# Patient Record
Sex: Female | Born: 1935
Health system: Southern US, Community
[De-identification: ages and names within clinical notes are randomized; demographics above are authoritative.]

## PROBLEM LIST (undated history)

## (undated) DIAGNOSIS — I10 Essential (primary) hypertension: Secondary | ICD-10-CM

## (undated) DIAGNOSIS — F419 Anxiety disorder, unspecified: Secondary | ICD-10-CM

## (undated) DIAGNOSIS — K759 Inflammatory liver disease, unspecified: Secondary | ICD-10-CM

## (undated) DIAGNOSIS — E785 Hyperlipidemia, unspecified: Secondary | ICD-10-CM

## (undated) DIAGNOSIS — J449 Chronic obstructive pulmonary disease, unspecified: Secondary | ICD-10-CM

## (undated) DIAGNOSIS — I2510 Atherosclerotic heart disease of native coronary artery without angina pectoris: Secondary | ICD-10-CM

## (undated) DIAGNOSIS — E119 Type 2 diabetes mellitus without complications: Secondary | ICD-10-CM

## (undated) DIAGNOSIS — N390 Urinary tract infection, site not specified: Secondary | ICD-10-CM

## (undated) DIAGNOSIS — R413 Other amnesia: Secondary | ICD-10-CM

## (undated) DIAGNOSIS — I219 Acute myocardial infarction, unspecified: Secondary | ICD-10-CM

## (undated) DIAGNOSIS — D649 Anemia, unspecified: Secondary | ICD-10-CM

## (undated) DIAGNOSIS — J189 Pneumonia, unspecified organism: Secondary | ICD-10-CM

## (undated) HISTORY — DX: Acute myocardial infarction, unspecified: I21.9

## (undated) HISTORY — DX: Hyperlipidemia, unspecified: E78.5

## (undated) HISTORY — DX: Atherosclerotic heart disease of native coronary artery without angina pectoris: I25.10

## (undated) HISTORY — DX: Type 2 diabetes mellitus without complications: E11.9

## (undated) HISTORY — PX: TONSILLECTOMY: SUR1361

## (undated) HISTORY — DX: Essential (primary) hypertension: I10

## (undated) HISTORY — DX: Other amnesia: R41.3

## (undated) HISTORY — PX: ABDOMINAL HYSTERECTOMY: SHX81

## (undated) HISTORY — DX: Inflammatory liver disease, unspecified: K75.9

## (undated) HISTORY — DX: Chronic obstructive pulmonary disease, unspecified: J44.9

---

## 1991-10-16 DIAGNOSIS — I219 Acute myocardial infarction, unspecified: Secondary | ICD-10-CM

## 1991-10-16 HISTORY — PX: LEFT HEART CATH: SHX5946

## 1991-10-16 HISTORY — DX: Acute myocardial infarction, unspecified: I21.9

## 2009-02-14 ENCOUNTER — Inpatient Hospital Stay (HOSPITAL_COMMUNITY): Admission: EM | Admit: 2009-02-14 | Discharge: 2009-02-15 | Payer: Self-pay | Admitting: Cardiology

## 2009-02-14 ENCOUNTER — Ambulatory Visit: Payer: Self-pay | Admitting: Diagnostic Radiology

## 2009-02-14 ENCOUNTER — Encounter: Payer: Self-pay | Admitting: Emergency Medicine

## 2011-01-23 LAB — DIFFERENTIAL
Basophils Relative: 1 % (ref 0–1)
Eosinophils Relative: 2 % (ref 0–5)
Lymphocytes Relative: 45 % (ref 12–46)
Lymphocytes Relative: 52 % — ABNORMAL HIGH (ref 12–46)
Monocytes Absolute: 0.5 10*3/uL (ref 0.1–1.0)
Monocytes Absolute: 0.7 10*3/uL (ref 0.1–1.0)
Monocytes Relative: 8 % (ref 3–12)
Monocytes Relative: 9 % (ref 3–12)
Neutro Abs: 2.5 10*3/uL (ref 1.7–7.7)
Neutro Abs: 3.2 10*3/uL (ref 1.7–7.7)

## 2011-01-23 LAB — CBC
HCT: 36.3 % (ref 36.0–46.0)
HCT: 41.6 % (ref 36.0–46.0)
Hemoglobin: 13.6 g/dL (ref 12.0–15.0)
Platelets: 206 10*3/uL (ref 150–400)
RBC: 4.28 MIL/uL (ref 3.87–5.11)
RDW: 13.2 % (ref 11.5–15.5)

## 2011-01-23 LAB — POCT CARDIAC MARKERS
CKMB, poc: <1 ng/mL — ABNORMAL LOW (ref 1.0–8.0)
Myoglobin, poc: 56.7 ng/mL (ref 12–200)
Troponin i, poc: <0.05 ng/mL (ref 0.00–0.09)

## 2011-01-23 LAB — BASIC METABOLIC PANEL
CO2: 30 mEq/L (ref 19–32)
Calcium: 9.6 mg/dL (ref 8.4–10.5)
GFR calc Af Amer: >60 mL/min (ref >60)
GFR calc non Af Amer: >60 mL/min (ref >60)
Potassium: 4.1 mEq/L (ref 3.5–5.1)
Sodium: 142 mEq/L (ref 135–145)

## 2011-01-23 LAB — GLUCOSE, CAPILLARY
Glucose-Capillary: 78 mg/dL (ref 70–99)
Glucose-Capillary: 83 mg/dL (ref 70–99)

## 2011-01-23 LAB — COMPREHENSIVE METABOLIC PANEL
Albumin: 2.8 g/dL — ABNORMAL LOW (ref 3.5–5.2)
Alkaline Phosphatase: 46 U/L (ref 39–117)
BUN: 9 mg/dL (ref 6–23)
GFR calc Af Amer: >60 mL/min (ref >60)
Potassium: 3.8 mEq/L (ref 3.5–5.1)
Sodium: 144 mEq/L (ref 135–145)
Total Protein: 5.8 g/dL — ABNORMAL LOW (ref 6.0–8.3)

## 2011-01-23 LAB — LIPID PANEL
HDL: 34 mg/dL — ABNORMAL LOW (ref >39)
Triglycerides: 148 mg/dL (ref <150)
VLDL: 30 mg/dL (ref 0–40)

## 2011-01-23 LAB — TSH: TSH: 2.85 u[IU]/mL (ref 0.350–4.500)

## 2011-01-23 LAB — CARDIAC PANEL(CRET KIN+CKTOT+MB+TROPI)
CK, MB: 1.2 ng/mL (ref 0.3–4.0)
Relative Index: INVALID (ref 0.0–2.5)
Total CK: 30 U/L (ref 7–177)
Total CK: 49 U/L (ref 7–177)

## 2011-01-23 LAB — D-DIMER, QUANTITATIVE: D-Dimer, Quant: 0.86 ug/mL-FEU — ABNORMAL HIGH (ref 0.00–0.48)

## 2011-01-23 LAB — HEMOGLOBIN A1C
Hgb A1c MFr Bld: 5.5 % (ref 4.6–6.1)
Mean Plasma Glucose: 111 mg/dL

## 2011-01-23 LAB — MAGNESIUM: Magnesium: 2.1 mg/dL (ref 1.5–2.5)

## 2011-02-27 NOTE — Discharge Summary (Signed)
NAME:  Karen Hunter, Karen Hunter NO.:  [id number]   MEDICAL RECORD NO.:  [id number]          PATIENT TYPE:  INP   LOCATION:  4733                         FACILITY:  [facility]   PHYSICIAN:  Cristy Hilts. Jacinto Halim, MD       DATE OF BIRTH:  02-Dec-1930   DATE OF ADMISSION:  02/14/2009  DATE OF DISCHARGE:  02/15/2009                               DISCHARGE SUMMARY   DISCHARGE DIAGNOSES:  1. Chest pain, worrisome for unstable angina, myocardial infarction      ruled out this admission.  2. History of coronary disease with remote myocardial infarction in      1993, treated with angioplasty in [place].  3. Dyslipidemia.  4. Non-insulin-dependent type 2 diabetes.   HOSPITAL COURSE:  The patient is a 77 year old female who is from [place].  In 1993, she had an angioplasty.  She had a stress test about 6  years ago, she is followed by an Administrator, arts in [place].  She was here  visiting when she had pneumonia a couple of weeks ago.  She presented to  the emergency room on Feb 15, 2009, with chest pain, worrisome for  unstable angina.  She was seen in consult by Dr. Garen Lah.  She is  admitted to telemetry for observation.  CK-MB and troponins were  negative x3.  The patient was placed on heparin on arrival.  CT scan of  her chest was negative for pulmonary embolism.  Apparently, the patient  is going to help her daughter recover from a hysterectomy and then  return to [place].  The patient requests to be discharged and follow up  with her cardiologist in [place] instead of having any further workup  here in [place].  Dr. Garen Lah will agree to this, but she has been  sternly instructed to call us if she has any more chest pain.  She has  been ambulating on the floor and is ready for discharge on Feb 15, 2009.   DISCHARGE MEDICATIONS:  1. Imdur 15 mg a day.  2. Simvastatin 20 mg a day.  3. Ramipril 5 mg a day.  4. Amitriptyline 50 mg nightly.  5. Novolin subcu daily.  6. Aspirin 81 mg a  day.  7. Fish oil 1 g b.i.d.  8. Glucosamine 2 tablets daily.  9. Multivitamin daily.  10.B12 daily.  11.Stool softener p.r.n.  12.Omeprazole 20 mg a day.  13.Metformin 500 mg b.i.d.  14.Nitroglycerin 0.4 mg sublingual p.r.n.   LABORATORY DATA:  CK-MB and troponins were negative x3.  Sodium 142,  potassium 4.1, BUN 12, creatinine 0.8.  INR 0.9.  White count 7.4,  hemoglobin 13.6, hematocrit 41.6, platelets 249.   EKG shows sinus rhythm without acute changes.   DISPOSITION:  The patient is discharged in stable condition.  She will  call us if she needs [place] p.r.n. over the next week.  She will contact her  cardiologist this week also and arrange an appointment to be seen as  soon as she gets back to [place].      Abelino Derrick,  P.A.      Vonna Kotyk R. Jacinto Halim, MD     LKK/MEDQ  D:  02/15/2009  T:  02/16/2009  Job:  [id number]

## 2013-10-01 ENCOUNTER — Ambulatory Visit (INDEPENDENT_AMBULATORY_CARE_PROVIDER_SITE_OTHER): Payer: Medicare PPO | Admitting: Cardiovascular Disease

## 2013-10-01 ENCOUNTER — Encounter: Payer: Self-pay | Admitting: Cardiovascular Disease

## 2013-10-01 VITALS — BP 118/68 | HR 100 | Ht 59.0 in | Wt 106.0 lb

## 2013-10-01 DIAGNOSIS — I2510 Atherosclerotic heart disease of native coronary artery without angina pectoris: Secondary | ICD-10-CM | POA: Insufficient documentation

## 2013-10-01 DIAGNOSIS — I10 Essential (primary) hypertension: Secondary | ICD-10-CM | POA: Insufficient documentation

## 2013-10-01 DIAGNOSIS — E785 Hyperlipidemia, unspecified: Secondary | ICD-10-CM

## 2013-10-01 NOTE — Assessment & Plan Note (Addendum)
On statin therapy followed by her PCP.her most recent lipid profile performed 07/06/13 revealed a total cholesterol 145, LDL 75 and HDL of 45.

## 2013-10-01 NOTE — Assessment & Plan Note (Signed)
Controlled on current medications 

## 2013-10-01 NOTE — Assessment & Plan Note (Signed)
Patient myocardial infarction 66 in [place]. She underwent PCI at that time ultimately this vessel close but she was told the collaterals formed. She is asymptomatic. She had a pharmacologic stress test in your peripheries ago and was told that it was low risk. She is on appropriate medications. I will see her back on an annual basis.

## 2013-10-01 NOTE — Progress Notes (Signed)
10/01/2013 Karen Hunter   [birth year]  [id number]  Primary Physician Karen Penna, MD Primary Cardiologist: Karen Gess MD Karen Hunter   HPI:  [name] is a 79 year old thin appearing Karen Hunter Caucasian female mother father, grandmother tonight grandchildren who was referred by Dr. Alysia [facility] cardiovascular practice because of known CAD. Her risk factors include ongoing tobacco abuse smoking one pack a day for left 50 years recalcitrant to modification. History of hypertension and hyperlipidemia. She did have a microinfarction in 1993 underwent PCI. Ultimately this was shown to have occluded and collaterals from appears she is asymptomatic. She had a pharmacologic stress test done peripheries  which was apparently low risk.   Current Outpatient Prescriptions  Medication Sig Dispense Refill  . amitriptyline (ELAVIL) 50 MG tablet Take 1 tablet by mouth daily.      Marland Kitchen aspirin 81 MG tablet Take 81 mg by mouth daily.      . Cholecalciferol (VITAMIN D-3 PO) Take by mouth daily.      Marland Kitchen MAGNESIUM PO Take by mouth daily.      . Multiple Vitamin (MULTIVITAMIN) capsule Take 1 capsule by mouth daily.      . ramipril (ALTACE) 2.5 MG capsule Take 1 capsule by mouth daily.      . simvastatin (ZOCOR) 10 MG tablet Take 10 mg by mouth daily.       No current facility-administered medications for this visit.    Allergies  Allergen Reactions  . Inh [Isoniazid] Other (See Comments)    hepatitis  . Naproxen Hives    History   Social History  . Marital Status: Married    Spouse Name: N/A    Number of Children: 5  . Years of Education: N/A   Occupational History  . Not on file.   Social History Main Topics  . Smoking status: Current Every Day Smoker -- 0.60 packs/day for 30 years    Types: Cigarettes  . Smokeless tobacco: Never Used  . Alcohol Use: Yes     Comment: occ mixed drinks  . Drug Use: No  . Sexual  Activity: Not on file   Other Topics Concern  . Not on file   Social History Narrative  . No narrative on file     Review of Systems: General: negative for chills, fever, night sweats or weight changes.  Cardiovascular: negative for chest pain, dyspnea on exertion, edema, orthopnea, palpitations, paroxysmal nocturnal dyspnea or shortness of breath Dermatological: negative for rash Respiratory: negative for cough or wheezing Urologic: negative for hematuria Abdominal: negative for nausea, vomiting, diarrhea, bright red blood per rectum, melena, or hematemesis Neurologic: negative for visual changes, syncope, or dizziness All other systems reviewed and are otherwise negative except as noted above.    Blood pressure 118/68, pulse 100, height 4\' 11"  (1.499 m), weight 106 lb (48.081 kg).  General appearance: alert and no distress Neck: no adenopathy, no carotid bruit, no JVD, supple, symmetrical, trachea midline and thyroid not enlarged, symmetric, no tenderness/mass/nodules Lungs: clear to auscultation bilaterally Heart: regular rate and rhythm, S1, S2 normal, no murmur, click, rub or gallop Abdomen: soft, non-tender; bowel sounds normal; no masses,  no organomegaly Extremities: extremities normal, atraumatic, no cyanosis or edema Pulses: 2+ and symmetric  EKG sinus tachycardia 100 with left axis deviation  ASSESSMENT AND PLAN:   Coronary artery disease Patient myocardial infarction 1993 in [place]. She underwent PCI at that time ultimately this vessel close but she  was told the collaterals formed. She is asymptomatic. She had a pharmacologic stress test in your peripheries ago and was told that it was low risk. She is on appropriate medications. I will see her back on an annual basis.  Essential hypertension Controlled on current medications  Hyperlipidemia On statin therapy followed by her PCP      Karen Gess MD [facility], [facility] 10/01/2013 2:29 PM

## 2013-10-01 NOTE — Patient Instructions (Signed)
Your physician wants you to follow-up in: 1 year with Dr Berry. You will receive a reminder letter in the mail two months in advance. If you don't receive a letter, please call our office to schedule the follow-up appointment.  

## 2013-10-02 ENCOUNTER — Encounter: Payer: Self-pay | Admitting: Cardiovascular Disease

## 2015-07-18 ENCOUNTER — Inpatient Hospital Stay (HOSPITAL_COMMUNITY)
Admission: EM | Admit: 2015-07-18 | Discharge: 2015-07-21 | DRG: 190 | Disposition: A | Discharge status: Home / Self Care | Payer: Medicare PPO | Attending: Internal Medicine | Admitting: Internal Medicine

## 2015-07-18 ENCOUNTER — Encounter (HOSPITAL_COMMUNITY): Payer: Self-pay | Admitting: *Deleted

## 2015-07-18 ENCOUNTER — Emergency Department (HOSPITAL_COMMUNITY): Payer: Medicare PPO

## 2015-07-18 DIAGNOSIS — I252 Old myocardial infarction: Secondary | ICD-10-CM | POA: Diagnosis not present

## 2015-07-18 DIAGNOSIS — F17210 Nicotine dependence, cigarettes, uncomplicated: Secondary | ICD-10-CM | POA: Diagnosis present

## 2015-07-18 DIAGNOSIS — R05 Cough: Secondary | ICD-10-CM | POA: Diagnosis present

## 2015-07-18 DIAGNOSIS — Z955 Presence of coronary angioplasty implant and graft: Secondary | ICD-10-CM | POA: Diagnosis not present

## 2015-07-18 DIAGNOSIS — E785 Hyperlipidemia, unspecified: Secondary | ICD-10-CM | POA: Diagnosis present

## 2015-07-18 DIAGNOSIS — E119 Type 2 diabetes mellitus without complications: Secondary | ICD-10-CM

## 2015-07-18 DIAGNOSIS — I509 Heart failure, unspecified: Secondary | ICD-10-CM | POA: Diagnosis present

## 2015-07-18 DIAGNOSIS — Z7982 Long term (current) use of aspirin: Secondary | ICD-10-CM | POA: Diagnosis not present

## 2015-07-18 DIAGNOSIS — Z79899 Other long term (current) drug therapy: Secondary | ICD-10-CM

## 2015-07-18 DIAGNOSIS — J9601 Acute respiratory failure with hypoxia: Secondary | ICD-10-CM | POA: Diagnosis not present

## 2015-07-18 DIAGNOSIS — E1165 Type 2 diabetes mellitus with hyperglycemia: Secondary | ICD-10-CM | POA: Diagnosis not present

## 2015-07-18 DIAGNOSIS — F329 Major depressive disorder, single episode, unspecified: Secondary | ICD-10-CM | POA: Diagnosis present

## 2015-07-18 DIAGNOSIS — F0390 Unspecified dementia without behavioral disturbance: Secondary | ICD-10-CM | POA: Diagnosis present

## 2015-07-18 DIAGNOSIS — I2510 Atherosclerotic heart disease of native coronary artery without angina pectoris: Secondary | ICD-10-CM | POA: Diagnosis present

## 2015-07-18 DIAGNOSIS — J441 Chronic obstructive pulmonary disease with (acute) exacerbation: Secondary | ICD-10-CM | POA: Diagnosis present

## 2015-07-18 DIAGNOSIS — I10 Essential (primary) hypertension: Secondary | ICD-10-CM | POA: Diagnosis not present

## 2015-07-18 DIAGNOSIS — I110 Hypertensive heart disease with heart failure: Secondary | ICD-10-CM | POA: Diagnosis present

## 2015-07-18 LAB — CBC
HEMATOCRIT: 42 % (ref 36.0–46.0)
HEMOGLOBIN: 13.7 g/dL (ref 12.0–15.0)
MCH: 32.7 pg (ref 26.0–34.0)
MCHC: 32.6 g/dL (ref 30.0–36.0)
MCV: 100.2 fL — ABNORMAL HIGH (ref 78.0–100.0)
Platelets: 170 10*3/uL (ref 150–400)
RBC: 4.19 MIL/uL (ref 3.87–5.11)
RDW: 12.8 % (ref 11.5–15.5)
WBC: 11.4 10*3/uL — ABNORMAL HIGH (ref 4.0–10.5)

## 2015-07-18 LAB — I-STAT TROPONIN, ED: TROPONIN I, POC: 0 ng/mL (ref 0.00–0.08)

## 2015-07-18 LAB — BASIC METABOLIC PANEL
ANION GAP: 10 (ref 5–15)
BUN: 20 mg/dL (ref 6–20)
CALCIUM: 8.8 mg/dL — AB (ref 8.9–10.3)
CO2: 26 mmol/L (ref 22–32)
Chloride: 98 mmol/L — ABNORMAL LOW (ref 101–111)
Creatinine, Ser: 1.15 mg/dL — ABNORMAL HIGH (ref 0.44–1.00)
GFR calc non Af Amer: 43 mL/min — ABNORMAL LOW (ref >60)
GFR, EST AFRICAN AMERICAN: 50 mL/min — AB (ref >60)
Glucose, Bld: 331 mg/dL — ABNORMAL HIGH (ref 65–99)
Potassium: 4.4 mmol/L (ref 3.5–5.1)
Sodium: 134 mmol/L — ABNORMAL LOW (ref 135–145)

## 2015-07-18 LAB — BRAIN NATRIURETIC PEPTIDE: B Natriuretic Peptide: 102.8 pg/mL — ABNORMAL HIGH (ref 0.0–100.0)

## 2015-07-18 MED ORDER — ASPIRIN EC 81 MG PO TBEC
81.0000 mg | DELAYED_RELEASE_TABLET | Freq: Every day | ORAL | Status: DC
  Administered 2015-07-19 – 2015-07-21 (×2): 81 mg via ORAL
  Filled 2015-07-18 (×4): qty 1

## 2015-07-18 MED ORDER — PREDNISONE 50 MG PO TABS
50.0000 mg | ORAL_TABLET | Freq: Every day | ORAL | Status: DC
  Administered 2015-07-19 – 2015-07-21 (×3): 50 mg via ORAL
  Filled 2015-07-18 (×3): qty 1

## 2015-07-18 MED ORDER — IPRATROPIUM BROMIDE 0.02 % IN SOLN
0.5000 mg | Freq: Once | RESPIRATORY_TRACT | Status: AC
  Administered 2015-07-18: 0.5 mg via RESPIRATORY_TRACT
  Filled 2015-07-18: qty 2.5

## 2015-07-18 MED ORDER — DONEPEZIL HCL 5 MG PO TABS
5.0000 mg | ORAL_TABLET | Freq: Every day | ORAL | Status: DC
Start: 2015-07-18 — End: 2015-07-21
  Administered 2015-07-19 – 2015-07-20 (×3): 5 mg via ORAL
  Filled 2015-07-18 (×3): qty 1

## 2015-07-18 MED ORDER — DEXTROMETHORPHAN POLISTIREX ER 30 MG/5ML PO SUER
60.0000 mg | Freq: Every day | ORAL | Status: DC | PRN
  Administered 2015-07-19: 60 mg via ORAL
  Filled 2015-07-18 (×2): qty 10

## 2015-07-18 MED ORDER — AZITHROMYCIN 500 MG PO TABS
500.0000 mg | ORAL_TABLET | Freq: Every day | ORAL | Status: AC
  Administered 2015-07-19: 500 mg via ORAL
  Filled 2015-07-18: qty 1

## 2015-07-18 MED ORDER — ENOXAPARIN SODIUM 30 MG/0.3ML ~~LOC~~ SOLN
30.0000 mg | SUBCUTANEOUS | Status: DC
  Administered 2015-07-19 – 2015-07-21 (×3): 30 mg via SUBCUTANEOUS
  Filled 2015-07-18 (×3): qty 0.3

## 2015-07-18 MED ORDER — ESCITALOPRAM OXALATE 10 MG PO TABS
10.0000 mg | ORAL_TABLET | Freq: Every day | ORAL | Status: DC
  Administered 2015-07-19 – 2015-07-21 (×2): 10 mg via ORAL
  Filled 2015-07-18 (×3): qty 1

## 2015-07-18 MED ORDER — SIMVASTATIN 10 MG PO TABS
10.0000 mg | ORAL_TABLET | Freq: Every day | ORAL | Status: DC
  Administered 2015-07-19 – 2015-07-21 (×2): 10 mg via ORAL
  Filled 2015-07-18 (×3): qty 1

## 2015-07-18 MED ORDER — METHYLPREDNISOLONE SODIUM SUCC 125 MG IJ SOLR
125.0000 mg | Freq: Once | INTRAMUSCULAR | Status: AC
  Administered 2015-07-18: 125 mg via INTRAVENOUS
  Filled 2015-07-18: qty 2

## 2015-07-18 MED ORDER — INSULIN ASPART 100 UNIT/ML ~~LOC~~ SOLN
0.0000 [IU] | Freq: Three times a day (TID) | SUBCUTANEOUS | Status: DC
  Administered 2015-07-19: 8 [IU] via SUBCUTANEOUS
  Administered 2015-07-19: 3 [IU] via SUBCUTANEOUS
  Administered 2015-07-19: 11 [IU] via SUBCUTANEOUS
  Administered 2015-07-20: 5 [IU] via SUBCUTANEOUS
  Administered 2015-07-20 – 2015-07-21 (×3): 3 [IU] via SUBCUTANEOUS

## 2015-07-18 MED ORDER — AZITHROMYCIN 500 MG PO TABS: 250.0000 mg | ORAL_TABLET | Freq: Every day | ORAL | Status: DC

## 2015-07-18 MED ORDER — ALBUTEROL SULFATE (2.5 MG/3ML) 0.083% IN NEBU
5.0000 mg | INHALATION_SOLUTION | Freq: Once | RESPIRATORY_TRACT | Status: AC
  Administered 2015-07-18: 5 mg via RESPIRATORY_TRACT
  Filled 2015-07-18: qty 6

## 2015-07-18 MED ORDER — TRAZODONE HCL 50 MG PO TABS
50.0000 mg | ORAL_TABLET | Freq: Every day | ORAL | Status: DC
Start: 2015-07-18 — End: 2015-07-21
  Administered 2015-07-19 – 2015-07-20 (×3): 50 mg via ORAL
  Filled 2015-07-18 (×3): qty 1

## 2015-07-18 MED ORDER — BUDESONIDE-FORMOTEROL FUMARATE 160-4.5 MCG/ACT IN AERO
2.0000 | INHALATION_SPRAY | Freq: Two times a day (BID) | RESPIRATORY_TRACT | Status: DC
  Administered 2015-07-19 – 2015-07-21 (×4): 2 via RESPIRATORY_TRACT
  Filled 2015-07-18: qty 6

## 2015-07-18 MED ORDER — AMITRIPTYLINE HCL 50 MG PO TABS
50.0000 mg | ORAL_TABLET | Freq: Every day | ORAL | Status: DC
  Administered 2015-07-19 – 2015-07-21 (×4): 50 mg via ORAL
  Filled 2015-07-18 (×4): qty 1

## 2015-07-18 NOTE — ED Notes (Signed)
Daughter came to nurse first desk concerned about pt's oxygen, O2 sat checked with a reading of 83%. Pt placed on oxygen in lobby, O2 sat increased to 93%. Pt will be the next to go to acute room.

## 2015-07-18 NOTE — H&P (Addendum)
[facility] History and Physical  Marjarie Irion Petralia ZOX:[id number] DOB: 05-14-1931 DOA: 07/18/2015  Referring physician: EDP PCP: Alysia Penna, MD   Chief Complaint: SOB   HPI: Karen Hunter is a 81 y.o. female with h/o COPD who presents to the ED with c/o cough, hoarse voice, SOB.  Symptoms have been ongoing for past 3-4 days with acute worsening today.  She has been using her husbands oxygen at home (normally not on oxygen).  Seen today at PCPs office with low O2 sats in the 70s and sent in to ED.  Review of Systems: Systems reviewed.  As above, otherwise negative  Past Medical History  Diagnosis Date  . Hyperlipidemia   . Diabetes mellitus without complication (HCC)   . Coronary artery disease   . Hepatitis     Inh-induced  . Heart attack (HCC) 1993  . Hypertension    Past Surgical History  Procedure Laterality Date  . Tonsillectomy  age 65  . Abdominal hysterectomy    . Coronary angioplasty with stent placement  1993   Social History:  reports that she has been smoking Cigarettes.  She has a 18 pack-year smoking history. She has never used smokeless tobacco. She reports that she drinks alcohol. She reports that she does not use illicit drugs.  Allergies  Allergen Reactions  . Inh [Isoniazid] Other (See Comments)    hepatitis  . Naproxen Hives    Family History  Problem Relation Age of Onset  . Breast cancer Mother   . Lung disease Father      Prior to Admission medications   Medication Sig Start Date End Date Taking? Authorizing Provider  alendronate (FOSAMAX) 70 MG tablet Take 70 mg by mouth once a week. 07/14/15  Yes Historical Provider, MD  amitriptyline (ELAVIL) 50 MG tablet Take 50 mg by mouth daily.  08/06/13  Yes Historical Provider, MD  aspirin 81 MG tablet Take 81 mg by mouth daily.   Yes Historical Provider, MD  Cholecalciferol (VITAMIN D-3 PO) Take 1 tablet by mouth daily.    Yes Historical Provider, MD  dextromethorphan (DELSYM) 30 MG/5ML  liquid Take 60 mg by mouth daily as needed for cough.   Yes Historical Provider, MD  escitalopram (LEXAPRO) 10 MG tablet Take 10 mg by mouth daily. 07/15/15  Yes Historical Provider, MD  simvastatin (ZOCOR) 10 MG tablet Take 10 mg by mouth daily. 08/14/13  Yes Historical Provider, MD  SYMBICORT 160-4.5 MCG/ACT inhaler Inhale 2 puffs into the lungs 2 (two) times daily. 05/16/15  Yes Historical Provider, MD  traZODone (DESYREL) 50 MG tablet Take 50 mg by mouth at bedtime. 06/23/15  Yes Historical Provider, MD  donepezil (ARICEPT) 5 MG tablet Take 5 mg by mouth at bedtime. 07/13/15   Historical Provider, MD   Physical Exam: Filed Vitals:   07/18/15 2045  BP: 111/48  Pulse: 92  Temp:   Resp: 21    BP 111/48 mmHg  Pulse 92  Temp(Src) 98.9 F (37.2 C) (Oral)  Resp 21  SpO2 93%  General Appearance:    Alert, oriented, no distress, appears stated age  Head:    Normocephalic, atraumatic  Eyes:    PERRL, EOMI, sclera non-icteric        Nose:   Nares without drainage or epistaxis. Mucosa, turbinates normal  Throat:   Moist mucous membranes. Oropharynx without erythema or exudate.  Neck:   Supple. No carotid bruits.  No thyromegaly.  No lymphadenopathy.   Back:  No CVA tenderness, no spinal tenderness  Lungs:     Poor air movement throughout  Chest wall:    No tenderness to palpitation  Heart:    Regular rate and rhythm without murmurs, gallops, rubs  Abdomen:     Soft, non-tender, nondistended, normal bowel sounds, no organomegaly  Genitalia:    deferred  Rectal:    deferred  Extremities:   No clubbing, cyanosis or edema.  Pulses:   2+ and symmetric all extremities  Skin:   Skin color, texture, turgor normal, no rashes or lesions  Lymph nodes:   Cervical, supraclavicular, and axillary nodes normal  Neurologic:   CNII-XII intact. Normal strength, sensation and reflexes      throughout    Labs on Admission:  Basic Metabolic Panel:  Recent Labs Lab 07/18/15 1835  NA 134*  K 4.4   CL 98*  CO2 26  GLUCOSE 331*  BUN 20  CREATININE 1.15*  CALCIUM 8.8*   Liver Function Tests: No results for input(s): AST, ALT, ALKPHOS, BILITOT, PROT, ALBUMIN in the last 168 hours. No results for input(s): LIPASE, AMYLASE in the last 168 hours. No results for input(s): AMMONIA in the last 168 hours. CBC:  Recent Labs Lab 07/18/15 1835  WBC 11.4*  HGB 13.7  HCT 42.0  MCV 100.2*  PLT 170   Cardiac Enzymes: No results for input(s): CKTOTAL, CKMB, CKMBINDEX, TROPONINI in the last 168 hours.  BNP (last 3 results) No results for input(s): PROBNP in the last 8760 hours. CBG: No results for input(s): GLUCAP in the last 168 hours.  Radiological Exams on Admission: Dg Chest 2 View  07/18/2015   CLINICAL DATA:  Shortness of breath and cough for 4 days.  EXAM: CHEST  2 VIEW  COMPARISON:  CT of the chest dated 02/15/2009  FINDINGS: Cardiomediastinal silhouette is normal. Mediastinal contours appear intact. Atherosclerotic calcifications of the aortic arch is seen.  There is no evidence of focal airspace consolidation or pneumothorax. There are bilateral lower lobe predominant increased interstitial markings. There are bilateral small pleural effusions.  Osseous structures are without acute abnormality. Soft tissues are grossly normal.  IMPRESSION: Emphysematous changes with superimposed mild pulmonary edema with bilateral pleural effusions.   Electronically Signed   By: Ted Mcalpine M.D.   On: 07/18/2015 20:04    EKG: Independently reviewed.  Assessment/Plan Principal Problem:   COPD exacerbation (HCC) Active Problems:   Essential hypertension   DM2 (diabetes mellitus, type 2) (HCC)   1. COPD exacerbation - 1. Steroids 2. Adult wheeze protocol 3. Azithromycin empirically 2. DM2 - 1. Mod dose SSI ac/hs 2. Carb mod diet 3. HTN - continue home meds    Code Status: Full  Family Communication: Family at bedside Disposition Plan: Admit to inpatient   Time spent: 70  min  Devinn Hurwitz M. [facility] Pager [phone]  If 7AM-7PM, please contact the day team taking care of the patient [email or web address] Password [email or web address] 07/18/2015, 9:41 PM

## 2015-07-18 NOTE — ED Notes (Signed)
Pt reports productive cough since Thursday that has gotten progressively worse. Also had low grade fever last night. Went to pcp and had chest xray done, sent here for probable pneumonia.

## 2015-07-18 NOTE — ED Provider Notes (Signed)
CSN: [id number]     Arrival date & time 07/18/15  1722 History   First MD Initiated Contact with Patient 07/18/15 1907     Chief Complaint  Patient presents with  . Cough  . Pneumonia  . Shortness of Breath     (Consider location/radiation/quality/duration/timing/severity/associated sxs/prior Treatment) Patient is a 81 y.o. female presenting with cough, pneumonia, and shortness of breath. The history is provided by the patient.  Cough Associated symptoms: chest pain and shortness of breath   Associated symptoms: no headaches and no rash   Pneumonia Associated symptoms include chest pain and shortness of breath. Pertinent negatives include no abdominal pain and no headaches.  Shortness of Breath Associated symptoms: chest pain and cough   Associated symptoms: no abdominal pain, no headaches, no rash and no vomiting    patient presents with shortness of breath. Has had a cough. Has had episodes of coughing spells. She's felt bad for last few days. She's been on her husband's oxygen but she is not on oxygen the past. She has a history of COPD. Sent by her primary care doctor with sats in the office of 70. Minimal sputum production. Slight chest pain. No swelling or legs. She is a smoker.  Past Medical History  Diagnosis Date  . Hyperlipidemia   . Diabetes mellitus without complication (HCC)   . Coronary artery disease   . Hepatitis     Inh-induced  . Heart attack (HCC) 1993  . Hypertension    Past Surgical History  Procedure Laterality Date  . Tonsillectomy  age 78  . Abdominal hysterectomy    . Coronary angioplasty with stent placement  1993   Family History  Problem Relation Age of Onset  . Breast cancer Mother   . Lung disease Father    Social History  Substance Use Topics  . Smoking status: Current Every Day Smoker -- 0.60 packs/day for 30 years    Types: Cigarettes  . Smokeless tobacco: Never Used  . Alcohol Use: Yes     Comment: occ mixed drinks   OB History    No data available     Review of Systems  Constitutional: Negative for activity change and appetite change.  Eyes: Negative for pain.  Respiratory: Positive for cough and shortness of breath. Negative for chest tightness.   Cardiovascular: Positive for chest pain. Negative for leg swelling.  Gastrointestinal: Negative for nausea, vomiting, abdominal pain and diarrhea.  Genitourinary: Negative for flank pain.  Musculoskeletal: Negative for back pain and neck stiffness.  Skin: Negative for rash.  Neurological: Negative for weakness, numbness and headaches.  Psychiatric/Behavioral: Negative for behavioral problems.      Allergies  Inh and Naproxen  Home Medications   Prior to Admission medications   Medication Sig Start Date End Date Taking? Authorizing Provider  alendronate (FOSAMAX) 70 MG tablet Take 70 mg by mouth once a week. 07/14/15  Yes Historical Provider, MD  amitriptyline (ELAVIL) 50 MG tablet Take 50 mg by mouth daily.  08/06/13  Yes Historical Provider, MD  aspirin 81 MG tablet Take 81 mg by mouth daily.   Yes Historical Provider, MD  Cholecalciferol (VITAMIN D-3 PO) Take 1 tablet by mouth daily.    Yes Historical Provider, MD  dextromethorphan (DELSYM) 30 MG/5ML liquid Take 60 mg by mouth daily as needed for cough.   Yes Historical Provider, MD  escitalopram (LEXAPRO) 10 MG tablet Take 10 mg by mouth daily. 07/15/15  Yes Historical Provider, MD  simvastatin (ZOCOR) 10 MG  tablet Take 10 mg by mouth daily. 08/14/13  Yes Historical Provider, MD  SYMBICORT 160-4.5 MCG/ACT inhaler Inhale 2 puffs into the lungs 2 (two) times daily. 05/16/15  Yes Historical Provider, MD  traZODone (DESYREL) 50 MG tablet Take 50 mg by mouth at bedtime. 06/23/15  Yes Historical Provider, MD  donepezil (ARICEPT) 5 MG tablet Take 5 mg by mouth at bedtime. 07/13/15   Historical Provider, MD   BP 102/43 mmHg  Pulse 89  Temp(Src) 97.5 F (36.4 C) (Oral)  Resp 18  SpO2 93% Physical Exam  Constitutional:  She is oriented to person, place, and time. She appears well-developed and well-nourished.  HENT:  Head: Normocephalic and atraumatic.  Eyes: Pupils are equal, round, and reactive to light.  Neck: Normal range of motion.  Cardiovascular: Normal rate, regular rhythm and normal heart sounds.   No murmur heard. Pulmonary/Chest: She is in respiratory distress. She has rales.  Somewhat prolonged expirations. No frank wheezes. Few scattered rales right base.  Abdominal: Soft. Bowel sounds are normal. She exhibits no distension. There is no tenderness.  Musculoskeletal: Normal range of motion.  Neurological: She is alert and oriented to person, place, and time. No cranial nerve deficit.  Skin: Skin is warm and dry.  Psychiatric: She has a normal mood and affect. Her speech is normal.  Nursing note and vitals reviewed.   ED Course  Procedures (including critical care time) Labs Review Labs Reviewed  BASIC METABOLIC PANEL - Abnormal; Notable for the following:    Sodium 134 (*)    Chloride 98 (*)    Glucose, Bld 331 (*)    Creatinine, Ser 1.15 (*)    Calcium 8.8 (*)    GFR calc non Af Amer 43 (*)    GFR calc Af Amer 50 (*)    All other components within normal limits  CBC - Abnormal; Notable for the following:    WBC 11.4 (*)    MCV 100.2 (*)    All other components within normal limits  BRAIN NATRIURETIC PEPTIDE - Abnormal; Notable for the following:    B Natriuretic Peptide 102.8 (*)    All other components within normal limits  GLUCOSE, CAPILLARY - Abnormal; Notable for the following:    Glucose-Capillary 280 (*)    All other components within normal limits  URINALYSIS, ROUTINE W REFLEX MICROSCOPIC (NOT AT [facility]) - Abnormal; Notable for the following:    Glucose, UA >1000 (*)    All other components within normal limits  URINE MICROSCOPIC-ADD ON - Abnormal; Notable for the following:    Bacteria, UA FEW (*)    Casts HYALINE CASTS (*)    All other components within normal limits   GLUCOSE, CAPILLARY - Abnormal; Notable for the following:    Glucose-Capillary 334 (*)    All other components within normal limits  GLUCOSE, CAPILLARY - Abnormal; Notable for the following:    Glucose-Capillary 259 (*)    All other components within normal limits  GLUCOSE, CAPILLARY - Abnormal; Notable for the following:    Glucose-Capillary 174 (*)    All other components within normal limits  GLUCOSE, CAPILLARY - Abnormal; Notable for the following:    Glucose-Capillary 323 (*)    All other components within normal limits  HEMOGLOBIN A1C  BASIC METABOLIC PANEL  CBC  I-STAT TROPOININ, ED    Imaging Review Dg Chest 2 View  07/18/2015   CLINICAL DATA:  Shortness of breath and cough for 4 days.  EXAM: CHEST  2  VIEW  COMPARISON:  CT of the chest dated 02/15/2009  FINDINGS: Cardiomediastinal silhouette is normal. Mediastinal contours appear intact. Atherosclerotic calcifications of the aortic arch is seen.  There is no evidence of focal airspace consolidation or pneumothorax. There are bilateral lower lobe predominant increased interstitial markings. There are bilateral small pleural effusions.  Osseous structures are without acute abnormality. Soft tissues are grossly normal.  IMPRESSION: Emphysematous changes with superimposed mild pulmonary edema with bilateral pleural effusions.   Electronically Signed   By: Ted Mcalpine M.D.   On: 07/18/2015 20:04   I have personally reviewed and evaluated these images and lab results as part of my medical decision-making.   EKG Interpretation   Date/Time:  Monday July 18 2015 18:14:40 EDT Ventricular Rate:  100 PR Interval:  170 QRS Duration: 92 QT Interval:  320 QTC Calculation: 412 R Axis:   -10 Text Interpretation:  Normal sinus rhythm Minimal voltage criteria for  LVH, may be normal variant Septal infarct , age undetermined Abnormal ECG  Confirmed by Rubin Payor  MD, Korena Nass [phone]) on 07/18/2015 7:21:08 PM      MDM   Final  diagnoses:  None  COPD exacerbation CHF  Patient presents with shortness of breath. Hypoxic. Likely emphysema but may have    a CHF component also. Will admit to internal medicine.  Benjiman Core, MD 07/20/15 [phone]

## 2015-07-19 DIAGNOSIS — J9601 Acute respiratory failure with hypoxia: Secondary | ICD-10-CM | POA: Diagnosis present

## 2015-07-19 LAB — URINE MICROSCOPIC-ADD ON

## 2015-07-19 LAB — URINALYSIS, ROUTINE W REFLEX MICROSCOPIC
Bilirubin Urine: NEGATIVE
Glucose, UA: >1000 mg/dL — AB
Hgb urine dipstick: NEGATIVE
Ketones, ur: NEGATIVE mg/dL
Leukocytes, UA: NEGATIVE
Nitrite: NEGATIVE
Protein, ur: NEGATIVE mg/dL
Specific Gravity, Urine: 1.017 (ref 1.005–1.030)
Urobilinogen, UA: 0.2 mg/dL (ref 0.0–1.0)
pH: 5.5 (ref 5.0–8.0)

## 2015-07-19 LAB — GLUCOSE, CAPILLARY
GLUCOSE-CAPILLARY: 334 mg/dL — AB (ref 65–99)
Glucose-Capillary: 174 mg/dL — ABNORMAL HIGH (ref 65–99)
Glucose-Capillary: 259 mg/dL — ABNORMAL HIGH (ref 65–99)
Glucose-Capillary: 280 mg/dL — ABNORMAL HIGH (ref 65–99)
Glucose-Capillary: 323 mg/dL — ABNORMAL HIGH (ref 65–99)

## 2015-07-19 MED ORDER — INFLUENZA VAC SPLIT QUAD 0.5 ML IM SUSY
0.5000 mL | PREFILLED_SYRINGE | INTRAMUSCULAR | Status: AC
  Administered 2015-07-20: 0.5 mL via INTRAMUSCULAR
  Filled 2015-07-19 (×2): qty 0.5

## 2015-07-19 MED ORDER — GUAIFENESIN ER 600 MG PO TB12
1200.0000 mg | ORAL_TABLET | Freq: Two times a day (BID) | ORAL | Status: DC
  Administered 2015-07-19 – 2015-07-21 (×5): 1200 mg via ORAL
  Filled 2015-07-19 (×5): qty 2

## 2015-07-19 MED ORDER — DEXTROSE 5 % IV SOLN
1.0000 g | INTRAVENOUS | Status: DC
  Administered 2015-07-19 – 2015-07-21 (×3): 1 g via INTRAVENOUS
  Filled 2015-07-19 (×4): qty 10

## 2015-07-19 MED ORDER — PNEUMOCOCCAL VAC POLYVALENT 25 MCG/0.5ML IJ INJ
0.5000 mL | INJECTION | INTRAMUSCULAR | Status: AC
  Administered 2015-07-20: 0.5 mL via INTRAMUSCULAR
  Filled 2015-07-19 (×2): qty 0.5

## 2015-07-19 MED ORDER — AZITHROMYCIN 500 MG PO TABS
500.0000 mg | ORAL_TABLET | Freq: Every day | ORAL | Status: DC
Start: 2015-07-19 — End: 2015-07-21
  Administered 2015-07-19 – 2015-07-21 (×3): 500 mg via ORAL
  Filled 2015-07-19 (×3): qty 1

## 2015-07-19 MED ORDER — GUAIFENESIN-DM 100-10 MG/5ML PO SYRP
5.0000 mL | ORAL_SOLUTION | ORAL | Status: DC | PRN
  Administered 2015-07-20 – 2015-07-21 (×4): 5 mL via ORAL
  Filled 2015-07-19 (×4): qty 5

## 2015-07-19 NOTE — Care Management Note (Signed)
Case Management Note  Patient Details  Name: Karen Hunter MRN: [id number] Date of Birth: [birth year]  Subjective/Objective:   Date: 07/19/15 Spoke with patient at the bedside. Introduced self as Sports coach and explained role in discharge planning and how to be reached. Verified patient lives in [place], alone with spouse and daughter. Expressed potential need for home oxygen . Verified patient anticipates to go home with family at time of discharge and will have full-time supervision by family friends neighbors at this time to best of their knowledge. Patient denied needing help with their medication. Patient  is driven by daughter to MD appointments. Verified patient has PCP [place]. Patient states she will have transportation at dc. Patient states if she needs oxygen, she would like to use the same company that her husband uses, she will find out what company has supplied his oxygen and let NCM know.  Plan: CM will continue to follow for discharge planning and [facility] resources.                  Action/Plan:   Expected Discharge Date:                  Expected Discharge Plan:  Home w Home Health Services  In-House Referral:     Discharge planning Services  CM Consult  Post Acute Care Choice:    Choice offered to:     DME Arranged:    DME Agency:     HH Arranged:    HH Agency:     Status of Service:  In process, will continue to follow  Medicare Important Message Given:    Date Medicare IM Given:    Medicare IM give by:    Date Additional Medicare IM Given:    Additional Medicare Important Message give by:     If discussed at Long Length of Stay Meetings, dates discussed:    Additional Comments:  [name], RN 07/19/2015, 10:41 AM

## 2015-07-19 NOTE — Progress Notes (Signed)
[facility] PROGRESS NOTE   Karen Hunter XBJ:[id number] DOB: 1931/06/18 DOA: 07/18/2015 PCP: Alysia Penna, MD  HPI/Subjective: Feels much better than yesterday, still has some shortness of breath.  Assessment/Plan: Principal Problem:   Acute respiratory failure with hypoxia (HCC) Active Problems:   Essential hypertension   COPD exacerbation (HCC)   DM2 (diabetes mellitus, type 2) (HCC)   Acute respiratory failure with hypoxia Patient presents to hospital with reported oxygen saturation in the 70s. Currently required 3-4 L of oxygen to maintain oxygen saturation in the lower 90s. Prior to discharge will check for oxygen requirements.  Acute exacerbation of COPD Patient presented to the hospital with shortness of breath, wheezing and cough/sputum production. No much of wheezing today. Started on IV antibiotics and IV steroids. Supportive management with bronchodilators, mucolytics, antitussives and oxygen as needed.  Diabetes mellitus type 2 Moderate dose sliding scale and check before meals and at bedtime CBGs. Carbohydrate modified diet, check hemoglobin A1c.  Essential hypertension Continue home medications  Code Status: Full Code Family Communication: Plan discussed with the patient. Disposition Plan: Remains inpatient Diet: Diet Carb Modified Fluid consistency:: Thin; Room service appropriate?: Yes  Consultants:  None  Procedures:  None  Antibiotics:  Rocephin and azithromycin. (indicate start date, and stop date if known)   Objective: Filed Vitals:   07/19/15 0415  BP: 92/42  Pulse: 73  Temp: 98 F (36.7 C)  Resp: 16    Intake/Output Summary (Last 24 hours) at 07/19/15 1126 Last data filed at 07/19/15 0700  Gross per 24 hour  Intake      0 ml  Output    700 ml  Net   -700 ml   There were no vitals filed for this visit.  Exam: General: Alert and awake, oriented x3, not in any acute distress. HEENT: anicteric sclera, pupils  reactive to light and accommodation, EOMI CVS: S1-S2 clear, no murmur rubs or gallops Chest: clear to auscultation bilaterally, no wheezing, rales or rhonchi Abdomen: soft nontender, nondistended, normal bowel sounds, no organomegaly Extremities: no cyanosis, clubbing or edema noted bilaterally Neuro: Cranial nerves II-XII intact, no focal neurological deficits  Data Reviewed: Basic Metabolic Panel:  Recent Labs Lab 07/18/15 1835  NA 134*  K 4.4  CL 98*  CO2 26  GLUCOSE 331*  BUN 20  CREATININE 1.15*  CALCIUM 8.8*   Liver Function Tests: No results for input(s): AST, ALT, ALKPHOS, BILITOT, PROT, ALBUMIN in the last 168 hours. No results for input(s): LIPASE, AMYLASE in the last 168 hours. No results for input(s): AMMONIA in the last 168 hours. CBC:  Recent Labs Lab 07/18/15 1835  WBC 11.4*  HGB 13.7  HCT 42.0  MCV 100.2*  PLT 170   Cardiac Enzymes: No results for input(s): CKTOTAL, CKMB, CKMBINDEX, TROPONINI in the last 168 hours. BNP (last 3 results)  Recent Labs  07/18/15 2055  BNP 102.8*    ProBNP (last 3 results) No results for input(s): PROBNP in the last 8760 hours.  CBG:  Recent Labs Lab 07/18/15 2340 07/19/15 0814  GLUCAP 280* 334*    Micro No results found for this or any previous visit (from the past 240 hour(s)).   Studies: Dg Chest 2 View  07/18/2015   CLINICAL DATA:  Shortness of breath and cough for 4 days.  EXAM: CHEST  2 VIEW  COMPARISON:  CT of the chest dated 02/15/2009  FINDINGS: Cardiomediastinal silhouette is normal. Mediastinal contours appear intact. Atherosclerotic calcifications of the aortic arch is seen.  There is no evidence of focal airspace consolidation or pneumothorax. There are bilateral lower lobe predominant increased interstitial markings. There are bilateral small pleural effusions.  Osseous structures are without acute abnormality. Soft tissues are grossly normal.  IMPRESSION: Emphysematous changes with superimposed  mild pulmonary edema with bilateral pleural effusions.   Electronically Signed   By: Ted Mcalpine M.D.   On: 07/18/2015 20:04    Scheduled Meds: . amitriptyline  50 mg Oral Daily  . aspirin EC  81 mg Oral Daily  . azithromycin  500 mg Oral Daily  . budesonide-formoterol  2 puff Inhalation BID  . cefTRIAXone (ROCEPHIN)  IV  1 g Intravenous Q24H  . donepezil  5 mg Oral QHS  . enoxaparin (LOVENOX) injection  30 mg Subcutaneous Q24H  . escitalopram  10 mg Oral Daily  . guaiFENesin  1,200 mg Oral BID  . [START ON 07/20/2015] Influenza vac split quadrivalent PF  0.5 mL Intramuscular Tomorrow-1000  . insulin aspart  0-15 Units Subcutaneous TID WC  . [START ON 07/20/2015] pneumococcal 23 valent vaccine  0.5 mL Intramuscular Tomorrow-1000  . predniSONE  50 mg Oral Q breakfast  . simvastatin  10 mg Oral Daily  . traZODone  50 mg Oral QHS   Continuous Infusions:      Time spent: 35 minutes    [facility]  [facility] Pager [phone] If 7PM-7AM, please contact night-coverage at [email or web address], password [facility] 07/19/2015, 11:26 AM  LOS: 1 day

## 2015-07-19 NOTE — Evaluation (Addendum)
Occupational Therapy Evaluation Patient Details Name: Karen Hunter MRN: [id number] DOB: 11-20-1930 Today's Date: 07/19/2015    History of Present Illness 81 y.o. female with h/o COPD who presents to the ED with c/o cough, hoarse voice, SOB.     Clinical Impression   Pt admitted with above. Pt independent with ADLs, PTA. Feel pt will benefit from acute OT to increase independence prior to d/c. Pt's O2 dropping in session (see vital section below).    Follow Up Recommendations  No OT follow up;Supervision/Assistance - 24 hour    Equipment Recommendations  None recommended by OT    Recommendations for Other Services       Precautions / Restrictions Precautions Precautions: Fall Precaution Comments: watch O2 sats Restrictions Weight Bearing Restrictions: No      Mobility Bed Mobility Overal bed mobility: Needs Assistance Bed Mobility: Supine to Sit;Sit to sidelying      Supine to sit: Supervision Sit to sidelying: Supervision      Transfers Overall transfer level: Needs assistance   Transfers: Sit to/from Stand Sit to Stand: Supervision              Balance slightly unsteady with initial sit to stand from bed. Supervision for ambulation.                                          ADL Overall ADL's : Needs assistance/impaired     Grooming: Wash/dry face;Standing;Set up;Supervision/safety               Lower Body Dressing: Set up;Supervision/safety;Sit to/from stand   Toilet Transfer: Supervision/safety;Ambulation;BSC   Toileting- Clothing Manipulation and Hygiene: Set up;Supervision/safety;Sit to/from stand       Functional mobility during ADLs: Supervision/safety General ADL Comments: Educated on energy conservation techniques and deep breathing technique. Cues for pt to take breaks in session.     Vision     Perception     Praxis      Pertinent Vitals/Pain Pain Assessment: No/denies pain; Pt on around 3L of O2  and O2 dropping in 70s-80s in session but would trend up to 90s with breaks and deep breathing.      Hand Dominance     Extremity/Trunk Assessment Upper Extremity Assessment Upper Extremity Assessment: Generalized weakness;Overall [facility] for tasks assessed   Lower Extremity Assessment Lower Extremity Assessment: Defer to PT evaluation       Communication Communication Communication: Other (comment) (seemed hoarse and talking effortful)   Cognition Arousal/Alertness: Awake/alert Behavior During Therapy: WFL for tasks assessed/performed Overall Cognitive Status: Within Functional Limits for tasks assessed                     General Comments       Exercises       Shoulder Instructions      Home Living Family/patient expects to be discharged to:: Private residence Living Arrangements: Spouse/significant other Available Help at Discharge: Family;Available 24 hours/day (spouse has lung cancer-reports daughter or son will be there) Type of Home: House Home Access: Ramped entrance     Home Layout: One level     Bathroom Shower/Tub: Producer, television/film/video: Standard     Home Equipment: Bedside commode;Shower seat;Walker - 2 wheels;Gilmer Mor - single point (spouse uses wheelchair and [name])   Additional Comments: spouse has O2 and pt has been using it  Prior Functioning/Environment Level of Independence: Independent             OT Diagnosis: Generalized weakness   OT Problem List: Decreased knowledge of use of DME or AE;Impaired balance (sitting and/or standing);Decreased strength;Decreased activity tolerance;Decreased knowledge of precautions   OT Treatment/Interventions: Self-care/ADL training;DME and/or AE instruction;Therapeutic activities;Therapeutic exercise;Energy conservation;Patient/family education;Balance training    OT Goals(Current goals can be found in the care plan section) Acute Rehab OT Goals Patient Stated Goal: not stated OT Goal  Formulation: With patient Time For Goal Achievement: 07/26/15 Potential to Achieve Goals: Good ADL Goals Pt Will Perform Upper Body Bathing: sitting;with modified independence Pt Will Perform Lower Body Bathing: with modified independence;sit to/from stand Pt Will Perform Lower Body Dressing: with modified independence;sit to/from stand Additional ADL Goal #1: Pt will independently verbalize 3 energy conservation techniqeus and utilize during session.  OT Frequency: Min 2X/week   Barriers to D/C:            Co-evaluation              End of Session Equipment Utilized During Treatment: Oxygen  Activity Tolerance: Patient tolerated treatment well Patient left: in bed;with call bell/phone within reach;with bed alarm set   Time: 0981-1914 OT Time Calculation (min): 19 min Charges:  OT General Charges $OT Visit: 1 Procedure OT Evaluation $Initial OT Evaluation Tier I: 1 Procedure G-CodesEarlie Raveling OTR/L [id number] 07/19/2015, 4:15 PM

## 2015-07-20 DIAGNOSIS — J441 Chronic obstructive pulmonary disease with (acute) exacerbation: Secondary | ICD-10-CM | POA: Diagnosis not present

## 2015-07-20 DIAGNOSIS — F329 Major depressive disorder, single episode, unspecified: Secondary | ICD-10-CM

## 2015-07-20 DIAGNOSIS — E785 Hyperlipidemia, unspecified: Secondary | ICD-10-CM

## 2015-07-20 LAB — CBC
HEMATOCRIT: 39.4 % (ref 36.0–46.0)
HEMOGLOBIN: 13.3 g/dL (ref 12.0–15.0)
MCH: 33.1 pg (ref 26.0–34.0)
MCHC: 33.8 g/dL (ref 30.0–36.0)
MCV: 98 fL (ref 78.0–100.0)
Platelets: 196 10*3/uL (ref 150–400)
RBC: 4.02 MIL/uL (ref 3.87–5.11)
RDW: 12.6 % (ref 11.5–15.5)
WBC: 14.4 10*3/uL — AB (ref 4.0–10.5)

## 2015-07-20 LAB — HEMOGLOBIN A1C
Hgb A1c MFr Bld: 8 % — ABNORMAL HIGH (ref 4.8–5.6)
MEAN PLASMA GLUCOSE: 183 mg/dL

## 2015-07-20 LAB — BASIC METABOLIC PANEL
ANION GAP: 9 (ref 5–15)
BUN: 24 mg/dL — ABNORMAL HIGH (ref 6–20)
CHLORIDE: 99 mmol/L — AB (ref 101–111)
CO2: 30 mmol/L (ref 22–32)
Calcium: 8.7 mg/dL — ABNORMAL LOW (ref 8.9–10.3)
Creatinine, Ser: 0.87 mg/dL (ref 0.44–1.00)
GFR calc Af Amer: >60 mL/min (ref >60)
Glucose, Bld: 226 mg/dL — ABNORMAL HIGH (ref 65–99)
POTASSIUM: 4.3 mmol/L (ref 3.5–5.1)
SODIUM: 138 mmol/L (ref 135–145)

## 2015-07-20 LAB — GLUCOSE, CAPILLARY
GLUCOSE-CAPILLARY: 172 mg/dL — AB (ref 65–99)
GLUCOSE-CAPILLARY: 201 mg/dL — AB (ref 65–99)
Glucose-Capillary: 174 mg/dL — ABNORMAL HIGH (ref 65–99)
Glucose-Capillary: 205 mg/dL — ABNORMAL HIGH (ref 65–99)

## 2015-07-20 NOTE — Progress Notes (Signed)
SATURATION QUALIFICATIONS: (This note is used to comply with regulatory documentation for home oxygen)  Patient Saturations on Room Air at Rest = Not appropriate  Patient Saturations on 3L at Rest = 94%  Patient Saturations on 3L of oxygen while Ambulating = 87%  Patient Saturations on 4 Liters of oxygen while Ambulating = 92%  Please briefly explain why patient needs home oxygen: Required 4L supplemental O2 while ambulating to maintain safe SpO2 level of >88%.  [address] [place], [place] [phone]

## 2015-07-20 NOTE — Progress Notes (Signed)
[facility]                                                                              Patient Demographics  Karen Hunter, is a 81 y.o. female, DOB - 08-Nov-1930, JXB:[id number]  Admit date - 07/18/2015   Admitting Physician Hillary Bow, DO  Outpatient Primary MD for the patient is Alysia Penna, MD  LOS - 2   Chief Complaint  Patient presents with  . Cough  . Pneumonia  . Shortness of Breath      HPI on 07/18/2015 by Dr. Lyda Perone Karen Hunter is a 81 y.o. female with h/o COPD who presents to the ED with c/o cough, hoarse voice, SOB. Symptoms have been ongoing for past 3-4 days with acute worsening today. She has been using her husbands oxygen at home (normally not on oxygen). Seen today at PCPs office with low O2 sats in the 70s and sent in to ED.  Assessment & Plan   Acute respiratory failure with hypoxia -Upon admission, oxygen saturations reported in the 70s  -Will attempt to wean off oxygen however suspect patient will need oxygen upon discharge -have asked for patient to be walked in oxygen saturations noted -PT consultation appreciated, currently pending -OT consulted and had no further recommendation  Acute exacerbation of COPD -Patient presented to the hospital with shortness of breath, wheezing and cough/sputum production. -Currently no wheezing -Continue antibiotics, steroids, bronchodilators, mucolytic, antitussives and supplemental oxygen -Patient counseled on smoking cessation  Tobacco abuse -Discussed smoking cessation -Husband also smokes  Diabetes mellitus type 2 -Continue insulin sliding scale and CBG monitoring -Hemoglobin A1c pending  Essential hypertension -Blood pressure was noted to be soft, currently 119/44 -On no blood pressure medications  Hyperlipidemia -Continue statin  Depression -Continue Lexapro  ? Dementia -Continue Aricept  Code Status: Full  Family Communication: None at bedside  Disposition Plan:  Admitted.  Will continue antibiotics and steroids.  Likely discharge in 24 hours.  PT pending.   Time Spent in minutes   30 minutes  Procedures  None  Consults   None  DVT Prophylaxis  Lovenox  Lab Results  Component Value Date   PLT 196 07/20/2015    Medications  Scheduled Meds: . amitriptyline  50 mg Oral Daily  . aspirin EC  81 mg Oral Daily  . azithromycin  500 mg Oral Daily  . budesonide-formoterol  2 puff Inhalation BID  . cefTRIAXone (ROCEPHIN)  IV  1 g Intravenous Q24H  . donepezil  5 mg Oral QHS  . enoxaparin (LOVENOX) injection  30 mg Subcutaneous Q24H  . escitalopram  10 mg Oral Daily  . guaiFENesin  1,200 mg Oral BID  . Influenza vac split quadrivalent PF  0.5 mL Intramuscular Tomorrow-1000  . insulin aspart  0-15 Units Subcutaneous TID WC  . pneumococcal 23 valent vaccine  0.5 mL Intramuscular Tomorrow-1000  . predniSONE  50 mg Oral Q breakfast  . simvastatin  10 mg Oral Daily  . traZODone  50 mg Oral QHS   Continuous Infusions:  PRN Meds:.guaiFENesin-dextromethorphan  Antibiotics    Anti-infectives    Start     Dose/Rate Route Frequency Ordered Stop   07/19/15  1000  azithromycin (ZITHROMAX) tablet 250 mg  Status:  Discontinued     250 mg Oral Daily 07/18/15 2137 07/19/15 0725   07/19/15 1000  azithromycin (ZITHROMAX) tablet 500 mg     500 mg Oral Daily 07/19/15 0725 07/23/15 0959   07/19/15 0730  cefTRIAXone (ROCEPHIN) 1 g in dextrose 5 % 50 mL IVPB     1 g 100 mL/hr over 30 Minutes Intravenous Every 24 hours 07/19/15 0725     07/18/15 2145  azithromycin (ZITHROMAX) tablet 500 mg     500 mg Oral Daily 07/18/15 2137 07/19/15 0012      Subjective:   Karen Hunter seen and examined today.  Patient feels that her breathing has improved. She does admit to smoking. She also states her husband smokes at home and uses oxygen. Patient currently denies any chest pain. Continues to have productive cough with yellow sputum. Denies any abdominal pain, nausea,  vomiting, dizziness or headache.  Objective:   Filed Vitals:   07/19/15 2113 07/20/15 0510 07/20/15 0821 07/20/15 1144  BP: 102/43 97/48 108/42 119/44  Pulse: 89 87 89 90  Temp: 97.5 F (36.4 C) 97.7 F (36.5 C) 97.7 F (36.5 C) 97.2 F (36.2 C)  TempSrc: Oral Oral Oral Oral  Resp: 18 18 18 24   SpO2: 93% 93% 94% 92%    Wt Readings from Last 3 Encounters:  10/01/13 48.081 kg (106 lb)     Intake/Output Summary (Last 24 hours) at 07/20/15 1231 Last data filed at 07/20/15 1125  Gross per 24 hour  Intake    222 ml  Output    825 ml  Net   -603 ml    Exam  General: Well developed, well nourished, NAD, elderly  HEENT: NCAT, mucous membranes moist.   Cardiovascular: S1 S2 auscultated, no rubs, murmurs or gallops. Regular rate and rhythm.  Respiratory: Diminished breath sounds, no wheezing or rhonchi noted. Occasional cough with sputum production.  Abdomen: Soft, nontender, nondistended, + bowel sounds  Extremities: warm dry without cyanosis clubbing or edema  Neuro: AAOx3, nonfocal  Data Review   Micro Results No results found for this or any previous visit (from the past 240 hour(s)).  Radiology Reports Dg Chest 2 View  07/18/2015   CLINICAL DATA:  Shortness of breath and cough for 4 days.  EXAM: CHEST  2 VIEW  COMPARISON:  CT of the chest dated 02/15/2009  FINDINGS: Cardiomediastinal silhouette is normal. Mediastinal contours appear intact. Atherosclerotic calcifications of the aortic arch is seen.  There is no evidence of focal airspace consolidation or pneumothorax. There are bilateral lower lobe predominant increased interstitial markings. There are bilateral small pleural effusions.  Osseous structures are without acute abnormality. Soft tissues are grossly normal.  IMPRESSION: Emphysematous changes with superimposed mild pulmonary edema with bilateral pleural effusions.   Electronically Signed   By: Ted Mcalpine M.D.   On: 07/18/2015 20:04     CBC  Recent Labs Lab 07/18/15 1835 07/20/15 0619  WBC 11.4* 14.4*  HGB 13.7 13.3  HCT 42.0 39.4  PLT 170 196  MCV 100.2* 98.0  MCH 32.7 33.1  MCHC 32.6 33.8  RDW 12.8 12.6    Chemistries   Recent Labs Lab 07/18/15 1835 07/20/15 0619  NA 134* 138  K 4.4 4.3  CL 98* 99*  CO2 26 30  GLUCOSE 331* 226*  BUN 20 24*  CREATININE 1.15* 0.87  CALCIUM 8.8* 8.7*   ------------------------------------------------------------------------------------------------------------------ CrCl cannot be calculated (Unknown ideal weight.). ------------------------------------------------------------------------------------------------------------------  Recent Labs  07/19/15 1312  HGBA1C 8.0*   ------------------------------------------------------------------------------------------------------------------ No results for input(s): CHOL, HDL, LDLCALC, TRIG, CHOLHDL, LDLDIRECT in the last 72 hours. ------------------------------------------------------------------------------------------------------------------ No results for input(s): TSH, T4TOTAL, T3FREE, THYROIDAB in the last 72 hours.  Invalid input(s): FREET3 ------------------------------------------------------------------------------------------------------------------ No results for input(s): VITAMINB12, FOLATE, FERRITIN, TIBC, IRON, RETICCTPCT in the last 72 hours.  Coagulation profile No results for input(s): INR, PROTIME in the last 168 hours.  No results for input(s): DDIMER in the last 72 hours.  Cardiac Enzymes No results for input(s): CKMB, TROPONINI, MYOGLOBIN in the last 168 hours.  Invalid input(s): CK ------------------------------------------------------------------------------------------------------------------ Invalid input(s): POCBNP    Christle Nolting D.O. on 07/20/2015 at 12:31 PM  Between 7am to 7pm - Pager - [phone]  After 7pm go to [email or web address] - password [email or web address]  And look for the  night coverage person covering for me after hours  [facility] Office  [phone]

## 2015-07-20 NOTE — Progress Notes (Signed)
Occupational Therapy Treatment Patient Details Name: Karen Hunter MRN: [id number] DOB: June 18, 1931 Today's Date: 07/20/2015    History of present illness 81 y.o. female with h/o COPD who presents to the ED with c/o cough, hoarse voice, SOB.  Admitted for Acute respiratory failure with hypoxia.   OT comments  Pt's O2 dropping in session with her on around 3L of Oxygen. Cues for breaks and deep breathing technique. Will continue to follow acutely.  Follow Up Recommendations  No OT follow up;Supervision/Assistance - 24 hour    Equipment Recommendations  None recommended by OT    Recommendations for Other Services      Precautions / Restrictions Precautions Precautions: Fall Precaution Comments: watch O2 sats Restrictions Weight Bearing Restrictions: No       Mobility Bed Mobility         General bed mobility comments: not assessed  Transfers Overall transfer level: Needs assistance Transfers: Sit to/from Stand Sit to Stand: Supervision            Balance No LOB in session.                    ADL Overall ADL's : Needs assistance/impaired Eating/Feeding: Independent;Sitting   Grooming: Wash/dry face;Oral care;Brushing hair;Set up;Supervision/safety;Standing   Upper Body Bathing: Set up;Supervision/ safety;Standing   Lower Body Bathing: Set up;Supervison/ safety (standing-washed bottom/peri area)   Upper Body Dressing : Set up;Supervision/safety;Standing   Lower Body Dressing: Set up;Supervision/safety;Sit to/from stand   Toilet Transfer: Supervision/safety;Ambulation (sit to stand from chair)           Functional mobility during ADLs: Supervision/safety General ADL Comments: Educated on energy conservation and deep breathing technique and gave pt handout. Recommended sitting for LB dressing.       Vision                     Perception     Praxis      Cognition  Awake/Alert Behavior During Therapy: WFL for tasks  assessed/performed Overall Cognitive Status: Within Functional Limits for tasks assessed                       Extremity/Trunk Assessment  Upper Extremity Assessment Upper Extremity Assessment: Defer to OT evaluation   Lower Extremity Assessment Lower Extremity Assessment: Generalized weakness        Exercises     Shoulder Instructions       General Comments      Pertinent Vitals/ Pain       Pain Assessment: No/denies pain; Pt on around 3L of Oxygen in session and O2 dropping in 80s, but would trend up-cues for deep breathing and breaks. HR up to 120s.   Home Living Family/patient expects to be discharged to:: Private residence Living Arrangements: Spouse/significant other;Children Available Help at Discharge: Family;Available 24 hours/day (spouse has lung cancer-reports daughter or son will be there) Type of Home: House Home Access: Ramped entrance     Home Layout: One level     Bathroom Shower/Tub: Producer, television/film/video: Standard     Home Equipment: Bedside commode;Shower seat;Walker - 2 wheels;Cane - single point (spouse uses wheelchair and [facility])   Additional Comments: spouse has O2 and pt has been using it      Prior Functioning/Environment Level of Independence: Independent            Frequency Min 2X/week     Progress Toward Goals  OT Goals(current goals can now be  found in the care plan section)  Progress towards OT goals: Progressing toward goals  Acute Rehab OT Goals Patient Stated Goal: hoping she would go home today OT Goal Formulation: With patient Time For Goal Achievement: 07/26/15 Potential to Achieve Goals: Good ADL Goals Pt Will Perform Upper Body Bathing: sitting;with modified independence Pt Will Perform Lower Body Bathing: with modified independence;sit to/from stand Pt Will Perform Lower Body Dressing: with modified independence;sit to/from stand Additional ADL Goal #1: Pt will independently verbalize 3  energy conservation techniqeus and utilize during session.  Plan Discharge plan remains appropriate    Co-evaluation                 End of Session Equipment Utilized During Treatment: Oxygen   Activity Tolerance Patient tolerated treatment well   Patient Left in chair;with call bell/phone within reach   Nurse Communication Other (comment) (asked tech about getting pt a new pulse oximeter for finger)        Time: 1340-1402 OT Time Calculation (min): 22 min  Charges: OT General Charges $OT Visit: 1 Procedure OT Treatments $Self Care/Home Management : 8-22 mins  Earlie Raveling  OTR/L [phone]  07/20/2015, 2:10 PM

## 2015-07-20 NOTE — Progress Notes (Signed)
Physical Therapy Treatment Patient Details Name: Karen Hunter MRN: [id number] DOB: November 08, 1930 Today's Date: 07/20/2015    History of Present Illness 81 y.o. female with h/o COPD who presents to the ED with c/o cough, hoarse voice, SOB.  Admitted for Acute respiratory failure with hypoxia.    PT Comments    Pt admitted with the above diagnosis. Pt currently with functional limitations due to the deficits listed below (see PT Problem List). Demonstrates mild instability with gait. SpO2 dropped to 87% on 3L supplemental O2. Required 4L, pursed lip breathing, and standing rest break to return to 92% SpO2.  Pt will benefit from skilled PT to increase their independence and safety with mobility to allow discharge to the venue listed below.     Follow Up Recommendations  Home health PT;Supervision for mobility/OOB     Equipment Recommendations  None recommended by PT    Recommendations for Other Services       Precautions / Restrictions Precautions Precautions: Fall Precaution Comments: watch O2 sats Restrictions Weight Bearing Restrictions: No    Mobility  Bed Mobility Overal bed mobility: Modified Independent             General bed mobility comments: extra time no assist  Transfers Overall transfer level: Needs assistance Equipment used: None Transfers: Sit to/from Stand Sit to Stand: Supervision         General transfer comment: Supervision for safety. Minimal sway no physical assist needed  Ambulation/Gait Ambulation/Gait assistance: Min guard Ambulation Distance (Feet): 150 Feet Assistive device: None Gait Pattern/deviations: Step-through pattern;Decreased stride length;Drifts right/left;Narrow base of support Gait velocity: decreased   General Gait Details: Intermittent sway but able to self correct. Min guard for safety. Cues to widen stance and focus on pursed lip breathing exercise while ambulating. SpO2 dropped to 87% on 3L, returned to 92% with 4L  and standing rest break. No overt loss of balance however pt does demonstrate some instability. Cues for awareness.   Stairs            Wheelchair Mobility    Modified Rankin (Stroke Patients Only)       Balance Overall balance assessment: Needs assistance Sitting-balance support: No upper extremity supported;Feet supported Sitting balance-Leahy Scale: Good     Standing balance support: No upper extremity supported Standing balance-Leahy Scale: Fair                      Cognition Arousal/Alertness: Awake/alert Behavior During Therapy: WFL for tasks assessed/performed Overall Cognitive Status: Within Functional Limits for tasks assessed                      Exercises      General Comments General comments (skin integrity, edema, etc.): SpO2 94% on 3L supplemental O2 at rest. Ambulating on 3L drops to 87%, improves to 92% with 4L and pursed lip breathing technique.  HR to 90s      Pertinent Vitals/Pain Pain Assessment: No/denies pain    Home Living Family/patient expects to be discharged to:: Private residence Living Arrangements: Spouse/significant other;Children Available Help at Discharge: Family;Available 24 hours/day (spouse has lung cancer-reports daughter or son will be there) Type of Home: House Home Access: Ramped entrance   Home Layout: One level Home Equipment: Bedside commode;Shower seat;Walker - 2 wheels;Cane - single point (spouse uses wheelchair and [facility]) Additional Comments: spouse has O2 and pt has been using it    Prior Function Level of Independence: Independent  PT Goals (current goals can now be found in the care plan section) Acute Rehab PT Goals Patient Stated Goal: Go home PT Goal Formulation: With patient Time For Goal Achievement: 08/03/15 Potential to Achieve Goals: Good    Frequency  Min 3X/week    PT Plan      Co-evaluation             End of Session Equipment Utilized During Treatment:  Oxygen Activity Tolerance: Patient tolerated treatment well Patient left: in chair;with call bell/phone within reach     Time: 1226-1249 PT Time Calculation (min) (ACUTE ONLY): 23 min  Charges:  $Gait Training: 8-22 mins                    G Codes:      Berton Mount August 01, 2015, 1:57 PM  Sunday [facility] [place], [place] [phone]

## 2015-07-20 NOTE — Consult Note (Signed)
Karen Asare EdD 

## 2015-07-21 LAB — BASIC METABOLIC PANEL
ANION GAP: 8 (ref 5–15)
BUN: 20 mg/dL (ref 6–20)
CO2: 29 mmol/L (ref 22–32)
Calcium: 8.3 mg/dL — ABNORMAL LOW (ref 8.9–10.3)
Chloride: 99 mmol/L — ABNORMAL LOW (ref 101–111)
Creatinine, Ser: 0.73 mg/dL (ref 0.44–1.00)
GFR calc Af Amer: >60 mL/min (ref >60)
GFR calc non Af Amer: >60 mL/min (ref >60)
GLUCOSE: 94 mg/dL (ref 65–99)
POTASSIUM: 3.8 mmol/L (ref 3.5–5.1)
Sodium: 136 mmol/L (ref 135–145)

## 2015-07-21 LAB — CBC
HEMATOCRIT: 39.5 % (ref 36.0–46.0)
Hemoglobin: 13.2 g/dL (ref 12.0–15.0)
MCH: 32.5 pg (ref 26.0–34.0)
MCHC: 33.4 g/dL (ref 30.0–36.0)
MCV: 97.3 fL (ref 78.0–100.0)
Platelets: 223 10*3/uL (ref 150–400)
RBC: 4.06 MIL/uL (ref 3.87–5.11)
RDW: 12.4 % (ref 11.5–15.5)
WBC: 11.9 10*3/uL — AB (ref 4.0–10.5)

## 2015-07-21 LAB — GLUCOSE, CAPILLARY
Glucose-Capillary: 112 mg/dL — ABNORMAL HIGH (ref 65–99)
Glucose-Capillary: 192 mg/dL — ABNORMAL HIGH (ref 65–99)

## 2015-07-21 MED ORDER — GUAIFENESIN ER 600 MG PO TB12: 1200.0000 mg | ORAL_TABLET | Freq: Two times a day (BID) | ORAL | Status: DC

## 2015-07-21 MED ORDER — GUAIFENESIN-DM 100-10 MG/5ML PO SYRP: 5.0000 mL | ORAL_SOLUTION | ORAL | Status: DC | PRN

## 2015-07-21 MED ORDER — AZITHROMYCIN 250 MG PO TABS: 250.0000 mg | ORAL_TABLET | Freq: Every day | ORAL | Status: DC

## 2015-07-21 MED ORDER — PREDNISONE 10 MG PO TABS: ORAL_TABLET | ORAL | Status: DC

## 2015-07-21 NOTE — Care Management Important Message (Signed)
Important Message  Patient Details  Name: Karen Hunter MRN: [id number] Date of Birth: 09/07/1931   Medicare Important Message Given:  Yes-second notification given    Kyla Balzarine 07/21/2015, 11:24 AM

## 2015-07-21 NOTE — Progress Notes (Addendum)
Patient Saturations on Room Air at Rest = 86%  Patient Saturations on [employer] while Ambulating = 82%  Patient Saturations on 4 Liters of oxygen while Ambulating = 92%  Patient's oxygen levels unsafe on room air at rest and while ambulating. Patient requires oxygen to maintain 02 >88%.

## 2015-07-21 NOTE — Care Management Note (Signed)
Case Management Note  Patient Details  Name: KANG ISHIDA MRN: [id number] Date of Birth: [birth year]  Subjective/Objective:        Patient is for dc today, she states she will work with [facility] for HHPT, referral made to [facility].  Soc will begin 24-48 hrs post dc.  Patient states her daughter Marcelino Duster will be transporting her home today.  Referral made to [facility] with [facility] for home oxygen.  Patient states she wants the same company that her spouse uses, but we have been unable to contact him.  NCM called [employer] and [employer] and they do not have her spouse name on there list.  Jermaine with [facility] states her husband was a hospice patient so more than likely he got it through [facility].  Patient states [facility] will be ok to get her oxygen with.            Action/Plan:   Expected Discharge Date:                  Expected Discharge Plan:  Home w Home Health Services  In-House Referral:     Discharge planning Services  CM Consult  Post Acute Care Choice:  Durable Medical Equipment, Home Health Choice offered to:     DME Arranged:  Oxygen DME Agency:  [facility]  HH Arranged:  PT [facility] Agency:  [facility]  Status of Service:  Completed, signed off  Medicare Important Message Given:  Yes-second notification given Date Medicare IM Given:    Medicare IM give by:    Date Additional Medicare IM Given:    Additional Medicare Important Message give by:     If discussed at Long Length of Stay Meetings, dates discussed:    Additional Comments:  [name], RN 07/21/2015, 11:59 AM

## 2015-07-21 NOTE — Progress Notes (Signed)
Karen Hunter to be D/C'd Home per MD order.  Discussed with the patient and all questions fully answered.  VSS, Skin clean, dry and intact without evidence of skin break down, no evidence of skin tears noted. IV catheter discontinued intact. Site without signs and symptoms of complications. Dressing and pressure applied.  An After Visit Summary was printed and given to the patient. Patient received prescription.  D/c education completed with patient and daughter including follow up instructions, medication list, d/c activities limitations if indicated, with other d/c instructions as indicated by MD - patient able to verbalize understanding, all questions fully answered.   Patient instructed to return to ED, call 911, or call MD for any changes in condition.   Patient to be escorted via WC, and D/C home via private auto.  [name], [name] C 07/21/2015 12:41 PM

## 2015-07-21 NOTE — Discharge Summary (Signed)
Physician Discharge Summary  Karen Hunter WUJ:[id number] DOB: 03-Jan-1931 DOA: 07/18/2015  PCP: Alysia Penna, MD  Admit date: 07/18/2015 Discharge date: 07/21/2015  Time spent: 45 minutes  Recommendations for Outpatient Follow-up:  Patient will be discharged to home with home health PT and supplemental oxygen.  Patient will need to follow up with primary care provider within one week of discharge.  Patient should continue medications as prescribed.  Patient should follow a carb modified diet.   Discharge Diagnoses:  Acute respiratory failure with hypoxia Acute exacerbation of COPD Tobacco abuse Diabetes mellitus, type II Essential hypertension Hyperlipidemia Depression Questionable dementia  Discharge Condition: Stable  Diet recommendation: carb modified   There were no vitals filed for this visit.  History of present illness:  on 07/18/2015 by Dr. Lyda Perone Karen Hunter is a 81 y.o. female with h/o COPD who presents to the ED with c/o cough, hoarse voice, SOB. Symptoms have been ongoing for past 3-4 days with acute worsening today. She has been using her husbands oxygen at home (normally not on oxygen). Seen today at PCPs office with low O2 sats in the 70s and sent in to ED.  Hospital Course:  Acute respiratory failure with hypoxia -Upon admission, oxygen saturations reported in the 70s  -Will attempt to wean off oxygen however suspect patient will need oxygen upon discharge -PT consultation appreciated, recommended home health -OT consulted and had no further recommendation -Patient will be discharged with supplemental O2  (4L) as her sats dropped to 87% while ambulating on 3L.    Acute exacerbation of COPD -Patient presented to the hospital with shortness of breath, wheezing and cough/sputum production. -Currently no wheezing -Was azithroymycin/ceftriaxone, steroids, bronchodilators, mucolytic, antitussives and supplemental oxygen -Patient counseled on  smoking cessation -Will discharge with azithromycin  Tobacco abuse -Discussed smoking cessation -Husband also smokes  Diabetes mellitus type 2 -Continue insulin sliding scale and CBG monitoring -Hemoglobin A1c 8  Essential hypertension -Blood pressure was noted to be soft, currently 120/72 -On no blood pressure medications  Hyperlipidemia -Continue statin  Depression -Continue Lexapro  ? Dementia -Continue Aricept  Procedures  None  Consults  None  Discharge Exam: Filed Vitals:   07/21/15 0712  BP: 120/72  Pulse: 92  Temp: 98.4 F (36.9 C)  Resp: 22   Exam  General: Well developed, well nourished, NAD, elderly  HEENT: NCAT, mucous membranes moist.   Cardiovascular: S1 S2 auscultated, RRR, no murmurs  Respiratory: Clear breath sounds, occ cough  Abdomen: Soft, nontender, nondistended, + bowel sounds  Extremities: warm dry without cyanosis clubbing or edema  Neuro: AAOx3, nonfocal  Discharge Instructions      Discharge Instructions    Discharge instructions    Complete by:  As directed   Patient will be discharged to home with home health PT and supplemental oxygen.  Patient will need to follow up with primary care provider within one week of discharge.  Patient should continue medications as prescribed.  Patient should follow a carb modified diet.            Medication List    TAKE these medications        alendronate 70 MG tablet  Commonly known as:  FOSAMAX  Take 70 mg by mouth once a week.     amitriptyline 50 MG tablet  Commonly known as:  ELAVIL  Take 50 mg by mouth daily.     aspirin 81 MG tablet  Take 81 mg by mouth daily.  DELSYM 30 MG/5ML liquid  Generic drug:  dextromethorphan  Take 60 mg by mouth daily as needed for cough.     donepezil 5 MG tablet  Commonly known as:  ARICEPT  Take 5 mg by mouth at bedtime.     escitalopram 10 MG tablet  Commonly known as:  LEXAPRO  Take 10 mg by mouth daily.      guaiFENesin 600 MG 12 hr tablet  Commonly known as:  MUCINEX  Take 2 tablets (1,200 mg total) by mouth 2 (two) times daily.     guaiFENesin-dextromethorphan 100-10 MG/5ML syrup  Commonly known as:  ROBITUSSIN DM  Take 5 mLs by mouth every 4 (four) hours as needed for cough.     predniSONE 10 MG tablet  Commonly known as:  DELTASONE  Take Prednisone  (4 tabs) x 3 days, then taper to  (3 tabs) x 3 days, then  (2 tabs) x 3days, then  (1 tab) x 3days, then OFF.     simvastatin 10 MG tablet  Commonly known as:  ZOCOR  Take 10 mg by mouth daily.     SYMBICORT 160-4.5 MCG/ACT inhaler  Generic drug:  budesonide-formoterol  Inhale 2 puffs into the lungs 2 (two) times daily.     traZODone 50 MG tablet  Commonly known as:  DESYREL  Take 50 mg by mouth at bedtime.     VITAMIN D-3 PO  Take 1 tablet by mouth daily.       Allergies  Allergen Reactions  . Inh [Isoniazid] Other (See Comments)    hepatitis  . Naproxen Hives   Follow-up Information    Follow up with Alysia Penna, MD. Schedule an appointment as soon as possible for a visit in 1 week.   Specialty:  Internal Medicine   Why:  Hospital follow up   Contact information:   [address] [place] [address] [phone]        The results of significant diagnostics from this hospitalization (including imaging, microbiology, ancillary and laboratory) are listed below for reference.    Significant Diagnostic Studies: Dg Chest 2 View  07/18/2015   CLINICAL DATA:  Shortness of breath and cough for 4 days.  EXAM: CHEST  2 VIEW  COMPARISON:  CT of the chest dated 02/15/2009  FINDINGS: Cardiomediastinal silhouette is normal. Mediastinal contours appear intact. Atherosclerotic calcifications of the aortic arch is seen.  There is no evidence of focal airspace consolidation or pneumothorax. There are bilateral lower lobe predominant increased interstitial markings. There are bilateral small pleural effusions.   Osseous structures are without acute abnormality. Soft tissues are grossly normal.  IMPRESSION: Emphysematous changes with superimposed mild pulmonary edema with bilateral pleural effusions.   Electronically Signed   By: Ted Mcalpine M.D.   On: 07/18/2015 20:04    Microbiology: No results found for this or any previous visit (from the past 240 hour(s)).   Labs: Basic Metabolic Panel:  Recent Labs Lab 07/18/15 1835 07/20/15 0619 07/21/15 0605  NA 134* 138 136  K 4.4 4.3 3.8  CL 98* 99* 99*  CO2 GLUCOSE 331* 226* 94  BUN 20 24* 20  CREATININE 1.15* 0.87 0.73  CALCIUM 8.8* 8.7* 8.3*   Liver Function Tests: No results for input(s): AST, ALT, ALKPHOS, BILITOT, PROT, ALBUMIN in the last 168 hours. No results for input(s): LIPASE, AMYLASE in the last 168 hours. No results for input(s): AMMONIA in the last 168 hours. CBC:  Recent Labs Lab 07/18/15 1835  07/20/15 0619 07/21/15 0605  WBC 11.4* 14.4* 11.9*  HGB 13.7 13.3 13.2  HCT 42.0 39.4 39.5  MCV 100.2* 98.0 97.3  PLT 170 196 223   Cardiac Enzymes: No results for input(s): CKTOTAL, CKMB, CKMBINDEX, TROPONINI in the last 168 hours. BNP: BNP (last 3 results)  Recent Labs  07/18/15 2055  BNP 102.8*    ProBNP (last 3 results) No results for input(s): PROBNP in the last 8760 hours.  CBG:  Recent Labs Lab 07/20/15 0754 07/20/15 1218 07/20/15 1725 07/20/15 2240 07/21/15 0752  GLUCAP 172* 201* 174* 205* 112*    Signed:  Edsel Petrin  [facility] 07/21/2015, 9:56 AM

## 2015-08-09 ENCOUNTER — Other Ambulatory Visit (HOSPITAL_COMMUNITY)
Admission: AD | Admit: 2015-08-09 | Discharge: 2015-08-09 | Disposition: A | Discharge status: Home / Self Care | Payer: Medicare PPO | Source: Skilled Nursing Facility | Attending: Internal Medicine | Admitting: Internal Medicine

## 2015-08-09 DIAGNOSIS — N390 Urinary tract infection, site not specified: Secondary | ICD-10-CM | POA: Diagnosis present

## 2015-08-09 DIAGNOSIS — Z87440 Personal history of urinary (tract) infections: Secondary | ICD-10-CM | POA: Diagnosis present

## 2015-08-09 LAB — URINALYSIS, ROUTINE W REFLEX MICROSCOPIC
BILIRUBIN URINE: NEGATIVE
HGB URINE DIPSTICK: NEGATIVE
KETONES UR: NEGATIVE mg/dL
Leukocytes, UA: NEGATIVE
Nitrite: NEGATIVE
PH: 7 (ref 5.0–8.0)
Protein, ur: NEGATIVE mg/dL
SPECIFIC GRAVITY, URINE: 1.015 (ref 1.005–1.030)
Urobilinogen, UA: 0.2 mg/dL (ref 0.0–1.0)

## 2015-08-09 LAB — URINE MICROSCOPIC-ADD ON

## 2015-08-10 LAB — URINE CULTURE: Culture: NO GROWTH

## 2015-08-21 ENCOUNTER — Emergency Department (HOSPITAL_BASED_OUTPATIENT_CLINIC_OR_DEPARTMENT_OTHER)
Admission: EM | Admit: 2015-08-21 | Discharge: 2015-08-21 | Disposition: A | Discharge status: Home / Self Care | Payer: Medicare PPO | Attending: Emergency Medicine | Admitting: Emergency Medicine

## 2015-08-21 ENCOUNTER — Encounter (HOSPITAL_BASED_OUTPATIENT_CLINIC_OR_DEPARTMENT_OTHER): Payer: Self-pay | Admitting: Emergency Medicine

## 2015-08-21 DIAGNOSIS — Z7982 Long term (current) use of aspirin: Secondary | ICD-10-CM | POA: Diagnosis not present

## 2015-08-21 DIAGNOSIS — I10 Essential (primary) hypertension: Secondary | ICD-10-CM | POA: Insufficient documentation

## 2015-08-21 DIAGNOSIS — E785 Hyperlipidemia, unspecified: Secondary | ICD-10-CM | POA: Insufficient documentation

## 2015-08-21 DIAGNOSIS — Z79899 Other long term (current) drug therapy: Secondary | ICD-10-CM | POA: Diagnosis not present

## 2015-08-21 DIAGNOSIS — I2510 Atherosclerotic heart disease of native coronary artery without angina pectoris: Secondary | ICD-10-CM | POA: Diagnosis not present

## 2015-08-21 DIAGNOSIS — Z720 Tobacco use: Secondary | ICD-10-CM | POA: Diagnosis not present

## 2015-08-21 DIAGNOSIS — E119 Type 2 diabetes mellitus without complications: Secondary | ICD-10-CM | POA: Insufficient documentation

## 2015-08-21 DIAGNOSIS — W01198A Fall on same level from slipping, tripping and stumbling with subsequent striking against other object, initial encounter: Secondary | ICD-10-CM | POA: Insufficient documentation

## 2015-08-21 DIAGNOSIS — Z8719 Personal history of other diseases of the digestive system: Secondary | ICD-10-CM | POA: Diagnosis not present

## 2015-08-21 DIAGNOSIS — S01511A Laceration without foreign body of lip, initial encounter: Secondary | ICD-10-CM | POA: Diagnosis not present

## 2015-08-21 DIAGNOSIS — Y9389 Activity, other specified: Secondary | ICD-10-CM | POA: Insufficient documentation

## 2015-08-21 DIAGNOSIS — W19XXXA Unspecified fall, initial encounter: Secondary | ICD-10-CM

## 2015-08-21 DIAGNOSIS — Y9289 Other specified places as the place of occurrence of the external cause: Secondary | ICD-10-CM | POA: Diagnosis not present

## 2015-08-21 DIAGNOSIS — Y998 Other external cause status: Secondary | ICD-10-CM | POA: Diagnosis not present

## 2015-08-21 DIAGNOSIS — S0033XA Contusion of nose, initial encounter: Secondary | ICD-10-CM

## 2015-08-21 DIAGNOSIS — Z792 Long term (current) use of antibiotics: Secondary | ICD-10-CM | POA: Insufficient documentation

## 2015-08-21 DIAGNOSIS — S0083XA Contusion of other part of head, initial encounter: Secondary | ICD-10-CM

## 2015-08-21 DIAGNOSIS — S0993XA Unspecified injury of face, initial encounter: Secondary | ICD-10-CM | POA: Diagnosis present

## 2015-08-21 DIAGNOSIS — Z8673 Personal history of transient ischemic attack (TIA), and cerebral infarction without residual deficits: Secondary | ICD-10-CM | POA: Insufficient documentation

## 2015-08-21 MED ORDER — LIDOCAINE HCL (PF) 1 % IJ SOLN
2.0000 mL | Freq: Once | INTRAMUSCULAR | Status: DC
  Filled 2015-08-21: qty 5

## 2015-08-21 NOTE — ED Provider Notes (Signed)
CSN: [id number]     Arrival date & time 08/21/15  1621 History  By signing my name below, I, Terrance Branch, attest that this documentation has been prepared under the direction and in the presence of Arby Barrette, MD. Electronically Signed: Evon Slack, ED Scribe. 08/21/2015. 5:42 PM.      Chief Complaint  Patient presents with  . Lip Laceration   The history is provided by the patient. No language interpreter was used.   HPI Comments: Karen Hunter is a 81 y.o. female who presents to the Emergency Department complaining of fall onset PTA. Pt states that she tripped over her oxygen tank. She state that she fell onto her out stretched hands but states that she did still hit her face. Pt reports upper lip laceration and nose pain  with swelling. Pt denies CP. No loss of consciousness. No mental status change. No neck pain. Patient denies other injury from her fall.    Past Medical History  Diagnosis Date  . Hyperlipidemia   . Diabetes mellitus without complication (HCC)   . Coronary artery disease   . Hepatitis     Inh-induced  . Heart attack (HCC) 1993  . Hypertension    Past Surgical History  Procedure Laterality Date  . Tonsillectomy  age 89  . Abdominal hysterectomy    . Coronary angioplasty with stent placement  1993   Family History  Problem Relation Age of Onset  . Breast cancer Mother   . Lung disease Father    Social History  Substance Use Topics  . Smoking status: Current Every Day Smoker -- 0.60 packs/day for 30 years    Types: Cigarettes  . Smokeless tobacco: Never Used  . Alcohol Use: Yes     Comment: occ mixed drinks   OB History    No data available     Review of Systems Constitutional: No generalized malaise or fever Respiratory: No chest pain no shortness of breath GI: No abdominal pain or vomiting   Allergies  Inh and Naproxen  Home Medications   Prior to Admission medications   Medication Sig Start Date End Date Taking? Authorizing  Provider  alendronate (FOSAMAX) 70 MG tablet Take 70 mg by mouth once a week. 07/14/15   Historical Provider, MD  amitriptyline (ELAVIL) 50 MG tablet Take 50 mg by mouth daily.  08/06/13   Historical Provider, MD  aspirin 81 MG tablet Take 81 mg by mouth daily.    Historical Provider, MD  azithromycin (ZITHROMAX) 250 MG tablet Take 1 tablet (250 mg total) by mouth daily. Completion of hospital course 07/21/15   Nita Sells Mikhail, DO  Cholecalciferol (VITAMIN D-3 PO) Take 1 tablet by mouth daily.     Historical Provider, MD  dextromethorphan (DELSYM) 30 MG/5ML liquid Take 60 mg by mouth daily as needed for cough.    Historical Provider, MD  donepezil (ARICEPT) 5 MG tablet Take 5 mg by mouth at bedtime. 07/13/15   Historical Provider, MD  escitalopram (LEXAPRO) 10 MG tablet Take 10 mg by mouth daily. 07/15/15   Historical Provider, MD  guaiFENesin (MUCINEX) 600 MG 12 hr tablet Take 2 tablets (1,200 mg total) by mouth 2 (two) times daily. 07/21/15   Maryann Mikhail, DO  guaiFENesin-dextromethorphan (ROBITUSSIN DM) 100-10 MG/5ML syrup Take 5 mLs by mouth every 4 (four) hours as needed for cough. 07/21/15   Maryann Mikhail, DO  predniSONE (DELTASONE) 10 MG tablet Take Prednisone  (4 tabs) x 3 days, then taper to  (3 tabs)  x 3 days, then 20mg  (2 tabs) x 3days, then 10mg  (1 tab) x 3days, then OFF. 07/21/15   Maryann Mikhail, DO  simvastatin (ZOCOR) 10 MG tablet Take 10 mg by mouth daily. 08/14/13   Historical Provider, MD  SYMBICORT 160-4.5 MCG/ACT inhaler Inhale 2 puffs into the lungs 2 (two) times daily. 05/16/15   Historical Provider, MD  traZODone (DESYREL) 50 MG tablet Take 50 mg by mouth at bedtime. 06/23/15   Historical Provider, MD   BP 113/94 mmHg  Pulse 96  Temp(Src) 98.3 F (36.8 C) (Oral)  Resp 16  Ht 5' (1.524 m)  Wt 100 lb (45.36 kg)  BMI 19.53 kg/m2  SpO2 100%   Physical Exam  Constitutional: She is oriented to person, place, and time. She appears well-developed and well-nourished.   HENT:  Right Ear: External ear normal.  Left Ear: External ear normal.  Mouth/Throat: Oropharynx is clear and moist.  Nose has moderate diffuse swelling with ecchymosis. No nasal laceration. No intranasal bleeding. No clots within the naris. Oral cavity, mucous membranes are pink and moist. Posterior oropharynx is widely patent without any blood dripping from the nasopharynx. Patient has a midline mucosal laceration to her upper lip. This is 5 mm. There is gaping present.contusion to the upper lip. Patient is edentulous on the top. Lower dentition is intact. There is mild ecchymosis to the chin without laceration or abrasion. Palpation shows all the bony structures to be intact without evidence of fracture. Motion of the temporomandibular joint is normal.  Eyes: EOM are normal. Pupils are equal, round, and reactive to light.  Neck: Neck supple.  No C-spine tenderness.  Pulmonary/Chest: Effort normal. She exhibits no tenderness.  Abdominal: Soft. She exhibits no distension. There is no tenderness.  Musculoskeletal: Normal range of motion. She exhibits no edema or tenderness.  Both hands and forearms normal without abrasions, deformity or pain with range of motion.  Neurological: She is alert and oriented to person, place, and time. She has normal strength. Coordination normal. GCS eye subscore is 4. GCS verbal subscore is 5. GCS motor subscore is 6.  Skin: Skin is warm, dry and intact.  Psychiatric: She has a normal mood and affect.    ED Course  Procedures (including critical care time) LACERATION REPAIR Performed by: Arby BarrettePfeiffer, Dywane Peruski Authorized by: Arby BarrettePfeiffer, Arshia Spellman Consent: Verbal consent obtained. Risks and benefits: risks, benefits and alternatives were discussed Consent given by: patient Patient identity confirmed: provided demographic data Prepped and Draped in normal sterile fashion Wound explored  Laceration Location: upper inner lip   Laceration Length: .5 cm  No Foreign  Bodies seen or palpated  Anesthesia: local infiltration  Local anesthetic: lidocaine  1%  Anesthetic total: 1 ml  Irrigation method: syringe Amount of cleaning: standard  Skin closure: Interrupted 4-0 Vicryl Rapide   Number of sutures: 2  Technique: simple interrupted   Patient tolerance: Patient tolerated the procedure well with no immediate complications. DIAGNOSTIC STUDIES: Oxygen Saturation is 100% on [place], normal by my interpretation.    COORDINATION OF CARE: 5:03 PM-Discussed treatment plan with pt at bedside and pt agreed to plan.     Labs Review Labs Reviewed - No data to display  Imaging Review No results found.    EKG Interpretation None      MDM   Final diagnoses:  Lip laceration, initial encounter  Nasal contusion, initial encounter  Chin contusion, initial encounter  Fall, initial encounter   Patient had a fall landing on her outstretched arms but  had associated facial injury. No extremity injury or other body injury reported. No loss of consciousness or confusion. Lip laceration is repaired. Patient has mild to moderate ecchymotic swelling of the nose and the chin. No evidence of facial fracture of the mandible or the maxilla. Possible nasal fracture versus contusion with swelling. There is no associated epistaxis or nasal laceration.     Arby Barrette, MD 08/21/15 1745

## 2015-08-21 NOTE — ED Notes (Signed)
Patient reports that she was coming out of dinner and tripped over her O2 tubing. The patient has upper lip pain and swelling

## 2015-08-21 NOTE — Discharge Instructions (Signed)
Mouth Laceration A mouth laceration is a deep cut in the lining of your mouth (mucosa). The laceration may extend into your lip or go all of the way through your mouth and cheek. Lacerations inside your mouth may involve your tongue, the insides of your cheeks, or the upper surface of your mouth (palate). Mouth lacerations may bleed a lot because your mouth has a very rich blood supply. Mouth lacerations may need to be repaired with stitches (sutures). CAUSES Any type of facial injury can cause a mouth laceration. Common causes include:  Getting hit in the mouth.  Being in a car accident. SYMPTOMS The most common sign of a mouth laceration is bleeding that fills the mouth. DIAGNOSIS Your health care provider can diagnose a mouth laceration by examining your mouth. Your mouth may need to be washed out (irrigated) with a sterile salt-water (saline) solution. Your health care provider may also have to remove any blood clots to determine how bad your injury is. You may need X-rays of the bones in your jaw or your face to rule out other injuries, such as dental injuries, facial fractures, or jaw fractures. TREATMENT Treatment depends on the location and severity of your injury. Small mouth lacerations may not need treatment if bleeding has stopped. You may need sutures if:  You have a tongue laceration.  Your mouth laceration is large or deep, or it continues to bleed. If sutures are necessary, your health care provider will use absorbable sutures that dissolve as your body heals. You may also receive antibiotic medicine or a tetanus shot. HOME CARE INSTRUCTIONS  Take medicines only as directed by your health care provider.  If you were prescribed an antibiotic medicine, finish all of it even if you start to feel better.  Eat as directed by your health care provider. You may only be able to drink liquids or eat soft foods for a few days.  Rinse your mouth with a warm, salt-water rinse 4-6  times per day or as directed by your health care provider. You can make a salt-water rinse by mixing one tsp of salt into two cups of warm water.  Do not poke the sutures with your tongue. Doing that can loosen them.  Check your wound every day for signs of infection. It is normal to have a white or gray patch over your wound while it heals. Watch for:  Redness.  Swelling.  Blood or pus.  Maintain regular oral hygiene, if possible. Gently brush your teeth with a soft, nylon-bristled toothbrush 2 times per day.  Keep all follow-up visits as directed by your health care provider. This is important. SEEK MEDICAL CARE IF:  You were given a tetanus shot and have swelling, severe pain, redness, or bleeding at the injection site.  You have a fever.  Your pain is not controlled with medicine.  You have redness, swelling, or pain at your wound that is getting worse.  You have fresh bleeding or pus coming from your wound.  The edges of your wound break open.  You develop swollen, tender glands in your throat. SEEK IMMEDIATE MEDICAL CARE IF:   Your face or the area under your jaw becomes swollen.  You have trouble breathing or swallowing.   This information is not intended to replace advice given to you by your health care provider. Make sure you discuss any questions you have with your health care provider.   Document Released: 10/01/2005 Document Revised: 02/15/2015 Document Reviewed: 09/22/2014 Elsevier Interactive Patient  Education 2016 [employer].  Possible Nasal Fracture A nasal fracture is a break or crack in the bones or cartilage of the nose. Minor breaks do not require treatment. These breaks usually heal on their own after about one month. Serious breaks may require surgery. CAUSES This injury is usually caused by a blunt injury to the nose. This type of injury often occurs from:  Contact sports.  Car accidents.  Falls.  Getting punched. SYMPTOMS Symptoms of  this injury include:  Pain.  Swelling of the nose.  Bleeding from the nose.  Bruising around the nose or eyes. This may include having black eyes.  Crooked appearance of the nose. DIAGNOSIS This injury may be diagnosed with a physical exam. The health care provider will gently feel the nose for signs of broken bones. He or she will look inside the nostrils to make sure that there is not a blood-filled swelling on the dividing wall between the nostrils (septal hematoma). X-rays of the nose may not show a nasal fracture even when one is present. In some cases, X-rays or a CT scan may be done 1-5 days after the injury. Sometimes, the health care provider will want to wait until the swelling has gone down. TREATMENT Often, minor fractures that have caused no deformity do not require treatment. More serious fractures in which bones have moved out of position may require surgery, which will take place after the swelling is gone. Surgery will stabilize and align the fracture. In some cases, a health care provider may be able to reposition the bones without surgery. This may be done in the health care provider's office after medicine is given to numb the area (local anesthetic). HOME CARE INSTRUCTIONS  If directed, apply ice to the injured area:  Put ice in a plastic bag.  Place a towel between your skin and the bag.  Leave the ice on for 20 minutes, 2-3 times per day.  Take over-the-counter and prescription medicines only as told by your health care provider.  If your nose starts to bleed, sit in an upright position while you squeeze the soft parts of your nose against the dividing wall between your nostrils (septum) for 10 minutes.  Try to avoid blowing your nose.  Return to your normal activities as told by your health care provider. Ask your health care provider what activities are safe for you.  Avoid contact sports for 3-4 weeks or as told by your health care provider.  Keep all  follow-up visits as told by your health care provider. This is important. SEEK MEDICAL CARE IF:  Your pain increases or becomes severe.  You continue to have nosebleeds.  The shape of your nose does not return to normal within 5 days.  You have pus draining out of your nose. SEEK IMMEDIATE MEDICAL CARE IF:  You have bleeding from your nose that does not stop after you pinch your nostrils closed for 20 minutes and keep ice on your nose.  You have clear fluid draining out of your nose.  You notice a grape-like swelling on the septum. This swelling is a collection of blood (hematoma) that must be drained to help prevent infection.  You have difficulty moving your eyes.  You have repeated vomiting.   This information is not intended to replace advice given to you by your health care provider. Make sure you discuss any questions you have with your health care provider.   Document Released: 09/28/2000 Document Revised: 06/22/2015 Document Reviewed: 11/08/2014  Elsevier Interactive Patient Education 2016 [employer]  Head Injury, Adult You have received a head injury. It does not appear serious at this time. Headaches and vomiting are common following head injury. It should be easy to awaken from sleeping. Sometimes it is necessary for you to stay in the emergency department for a while for observation. Sometimes admission to the hospital may be needed. After injuries such as yours, most problems occur within the first 24 hours, but side effects may occur up to 7-10 days after the injury. It is important for you to carefully monitor your condition and contact your health care provider or seek immediate medical care if there is a change in your condition. WHAT ARE THE TYPES OF HEAD INJURIES? Head injuries can be as minor as a bump. Some head injuries can be more severe. More severe head injuries include:  A jarring injury to the brain (concussion).  A bruise of the brain (contusion). This  mean there is bleeding in the brain that can cause swelling.  A cracked skull (skull fracture).  Bleeding in the brain that collects, clots, and forms a bump (hematoma). WHAT CAUSES A HEAD INJURY? A serious head injury is most likely to happen to someone who is in a car wreck and is not wearing a seat belt. Other causes of major head injuries include bicycle or motorcycle accidents, sports injuries, and falls. HOW ARE HEAD INJURIES DIAGNOSED? A complete history of the event leading to the injury and your current symptoms will be helpful in diagnosing head injuries. Many times, pictures of the brain, such as CT or MRI are needed to see the extent of the injury. Often, an overnight hospital stay is necessary for observation.  WHEN SHOULD I SEEK IMMEDIATE MEDICAL CARE?  You should get help right away if:  You have confusion or drowsiness.  You feel sick to your stomach (nauseous) or have continued, forceful vomiting.  You have dizziness or unsteadiness that is getting worse.  You have severe, continued headaches not relieved by medicine. Only take over-the-counter or prescription medicines for pain, fever, or discomfort as directed by your health care provider.  You do not have normal function of the arms or legs or are unable to walk.  You notice changes in the black spots in the center of the colored part of your eye (pupil).  You have a clear or bloody fluid coming from your nose or ears.  You have a loss of vision. During the next 24 hours after the injury, you must stay with someone who can watch you for the warning signs. This person should contact local emergency services (911 in the [place] if you have seizures, you become unconscious, or you are unable to wake up. HOW CAN I PREVENT A HEAD INJURY IN THE FUTURE? The most important factor for preventing major head injuries is avoiding motor vehicle accidents. To minimize the potential for damage to your head, it is crucial to wear seat  belts while riding in motor vehicles. Wearing helmets while bike riding and playing collision sports (like football) is also helpful. Also, avoiding dangerous activities around the house will further help reduce your risk of head injury.  WHEN CAN I RETURN TO NORMAL ACTIVITIES AND ATHLETICS? You should be reevaluated by your health care provider before returning to these activities. If you have any of the following symptoms, you should not return to activities or contact sports until 1 week after the symptoms have stopped:  Persistent headache.  Dizziness  or vertigo.  Poor attention and concentration.  Confusion.  Memory problems.  Nausea or vomiting.  Fatigue or tire easily.  Irritability.  Intolerant of bright lights or loud noises.  Anxiety or depression.  Disturbed sleep. MAKE SURE YOU:   Understand these instructions.  Will watch your condition.  Will get help right away if you are not doing well or get worse.   This information is not intended to replace advice given to you by your health care provider. Make sure you discuss any questions you have with your health care provider.   Document Released: 10/01/2005 Document Revised: 10/22/2014 Document Reviewed: 06/08/2013 Elsevier Interactive Patient Education [employer]

## 2015-10-19 DIAGNOSIS — E119 Type 2 diabetes mellitus without complications: Secondary | ICD-10-CM | POA: Diagnosis not present

## 2015-10-19 DIAGNOSIS — Z6821 Body mass index (BMI) 21.0-21.9, adult: Secondary | ICD-10-CM | POA: Diagnosis not present

## 2015-10-19 DIAGNOSIS — J069 Acute upper respiratory infection, unspecified: Secondary | ICD-10-CM | POA: Diagnosis not present

## 2015-10-19 DIAGNOSIS — E784 Other hyperlipidemia: Secondary | ICD-10-CM | POA: Diagnosis not present

## 2015-10-19 DIAGNOSIS — R05 Cough: Secondary | ICD-10-CM | POA: Diagnosis not present

## 2016-09-15 DIAGNOSIS — J439 Emphysema, unspecified: Secondary | ICD-10-CM | POA: Diagnosis not present

## 2016-09-15 DIAGNOSIS — J449 Chronic obstructive pulmonary disease, unspecified: Secondary | ICD-10-CM | POA: Diagnosis not present

## 2016-09-17 DIAGNOSIS — I25118 Atherosclerotic heart disease of native coronary artery with other forms of angina pectoris: Secondary | ICD-10-CM | POA: Diagnosis not present

## 2016-09-17 DIAGNOSIS — E1165 Type 2 diabetes mellitus with hyperglycemia: Secondary | ICD-10-CM | POA: Diagnosis not present

## 2016-09-17 DIAGNOSIS — Z6822 Body mass index (BMI) 22.0-22.9, adult: Secondary | ICD-10-CM | POA: Diagnosis not present

## 2016-09-17 DIAGNOSIS — L988 Other specified disorders of the skin and subcutaneous tissue: Secondary | ICD-10-CM | POA: Diagnosis not present

## 2016-09-17 DIAGNOSIS — D692 Other nonthrombocytopenic purpura: Secondary | ICD-10-CM | POA: Diagnosis not present

## 2016-09-17 DIAGNOSIS — D6489 Other specified anemias: Secondary | ICD-10-CM | POA: Diagnosis not present

## 2016-09-17 DIAGNOSIS — J449 Chronic obstructive pulmonary disease, unspecified: Secondary | ICD-10-CM | POA: Diagnosis not present

## 2016-09-17 DIAGNOSIS — M79602 Pain in left arm: Secondary | ICD-10-CM | POA: Diagnosis not present

## 2016-09-19 DIAGNOSIS — J440 Chronic obstructive pulmonary disease with acute lower respiratory infection: Secondary | ICD-10-CM | POA: Diagnosis not present

## 2016-09-29 DIAGNOSIS — J449 Chronic obstructive pulmonary disease, unspecified: Secondary | ICD-10-CM | POA: Diagnosis not present

## 2016-10-04 ENCOUNTER — Encounter: Payer: Self-pay | Admitting: *Deleted

## 2016-10-16 DIAGNOSIS — J449 Chronic obstructive pulmonary disease, unspecified: Secondary | ICD-10-CM | POA: Diagnosis not present

## 2016-10-16 DIAGNOSIS — J439 Emphysema, unspecified: Secondary | ICD-10-CM | POA: Diagnosis not present

## 2016-10-20 DIAGNOSIS — J440 Chronic obstructive pulmonary disease with acute lower respiratory infection: Secondary | ICD-10-CM | POA: Diagnosis not present

## 2016-10-23 ENCOUNTER — Encounter: Payer: Self-pay | Admitting: Internal Medicine

## 2016-10-23 DIAGNOSIS — E1140 Type 2 diabetes mellitus with diabetic neuropathy, unspecified: Secondary | ICD-10-CM | POA: Diagnosis not present

## 2016-10-23 DIAGNOSIS — E784 Other hyperlipidemia: Secondary | ICD-10-CM | POA: Diagnosis not present

## 2016-10-23 DIAGNOSIS — I10 Essential (primary) hypertension: Secondary | ICD-10-CM | POA: Diagnosis not present

## 2016-10-23 DIAGNOSIS — M859 Disorder of bone density and structure, unspecified: Secondary | ICD-10-CM | POA: Diagnosis not present

## 2016-10-25 ENCOUNTER — Encounter (INDEPENDENT_AMBULATORY_CARE_PROVIDER_SITE_OTHER): Payer: Self-pay

## 2016-10-25 ENCOUNTER — Encounter: Payer: Self-pay | Admitting: Internal Medicine

## 2016-10-25 ENCOUNTER — Ambulatory Visit (INDEPENDENT_AMBULATORY_CARE_PROVIDER_SITE_OTHER): Payer: Medicare HMO | Admitting: Internal Medicine

## 2016-10-25 VITALS — BP 110/50 | HR 81 | Ht 60.0 in | Wt 110.1 lb

## 2016-10-25 DIAGNOSIS — I252 Old myocardial infarction: Secondary | ICD-10-CM | POA: Diagnosis not present

## 2016-10-25 DIAGNOSIS — R0602 Shortness of breath: Secondary | ICD-10-CM | POA: Diagnosis not present

## 2016-10-25 NOTE — Progress Notes (Addendum)
Cardiology Office Note   Date:  10/25/2016   ID:  Karen Hunter, DOB 06/14/1931, MRN [id number]  PCP:  Alysia PennaHOLWERDA, SCOTT, MD  Cardiologist:   Dietrich PatesPaula Demarqus Jocson, MD    Pt referred for continued cardiac care   History of Present Illness: Karen Hunter is a 83 y.o. female with a history of CAD   She used to live in [place]  She suffered and MI in early 1990s  Cath done at time   Per report by pt (no records) it sounds like vessel closed and filled via collaterals  Not clear that intervention done After that time the pt denies CP  She was followed in cardiology for about 1 year   [place]  [place]  She is now on continuous O2  Has appt in pulmonary tomorrow  [place]reated for pne[place]ia in [place] with ABX  Still coughing  No fever    She is not that active  Has no energy   Sleeping more    MI in early 90s  Followed for about 1 year     (Addendum 11/20/16:  Two cath pictures from 1993 show RCA occlusion and then open post PTCA)     Current Meds  Medication Sig  . alendronate (FOSAMAX) 70 MG tablet Take 70 mg by mouth once a week.  Marland Kitchen. aspirin 81 MG tablet Take 81 mg by mouth daily.  . budesonide (PULMICORT) 0.5 MG/2ML nebulizer solution Take 0.5 mg by nebulization as directed.   . Cholecalciferol (VITAMIN D-3 PO) Take 1 tablet by mouth daily.   Marland Kitchen. dextromethorphan (DELSYM) 30 MG/5ML liquid Take 60 mg by mouth daily as needed for cough.  . donepezil (ARICEPT) 5 MG tablet Take 5 mg by mouth at bedtime.  Marland Kitchen. escitalopram (LEXAPRO) 10 MG tablet Take 10 mg by mouth daily.  Marland Kitchen. guaiFENesin (MUCINEX) 600 MG 12 hr tablet Take 2 tablets (1,200 mg total) by mouth 2 (two) times daily.  Marland Kitchen. guaiFENesin-dextromethorphan (ROBITUSSIN DM) 100-10 MG/5ML syrup Take 5 mLs by mouth every 4 (four) hours as needed for cough.  Marland Kitchen. ipratropium-albuterol (DUONEB) 0.5-2.5 (3) MG/3ML SOLN 3 mLs as directed.   . metFORMIN (GLUCOPHAGE) 500 MG tablet Take 500 mg by mouth 2 (two) times daily with  a meal.   . predniSONE (DELTASONE) 10 MG tablet Take Prednisone 40mg  (4 tabs) x 3 days, then taper to 30mg  (3 tabs) x 3 days, then 20mg  (2 tabs) x 3days, then 10mg  (1 tab) x 3days, then OFF.  Marland Kitchen. simvastatin (ZOCOR) 10 MG tablet Take 10 mg by mouth daily.  . traZODone (DESYREL) 50 MG tablet Take 50 mg by mouth at bedtime.     Allergies:   Inh [isoniazid] and Naproxen   Past Medical History:  Diagnosis Date  . Coronary artery disease   . Diabetes mellitus without complication (HCC)   . Heart attack 1993  . Hepatitis    Inh-induced  . Hyperlipidemia   . Hypertension     Past Surgical History:  Procedure Laterality Date  . ABDOMINAL HYSTERECTOMY    . CORONARY ANGIOPLASTY WITH STENT PLACEMENT  1993  . TONSILLECTOMY  age 89     Social History:  The patient  reports that she has been smoking Cigarettes.  She has a 18.00 pack-year smoking history. She has never used smokeless tobacco. She reports that she drinks alcohol. She reports that she does not use drugs.   Family History:  The  patient's family history includes Breast cancer in her mother; Lung disease in her father.    ROS:  Please see the history of present illness. All other systems are reviewed and  Negative to the above problem except as noted.    PHYSICAL EXAM: VS:  BP (!) 110/50   Pulse 81   Ht 5' (1.524 m)   Wt 110 lb 1.9 oz (50 kg)   BMI 21.51 kg/m   GEN: Frail 83 yo in no acute distress   On oxygen HEENT: normal  Neck: no JVD, carotid bruits, or masses Cardiac: RRR; no murmurs, rubs, or gallops,no edema  Respiratory:  Moving air  Some rhonchi   GI: soft, nontender, nondistended, + BS  No hepatomegaly  MS: Moving lall extremities  Decreased ROM L shoulder    Skin: warm and dry, no rash Psych: euthymic mood, full affect   EKG:  EKG is ordered today.  SR 81 bpm  Possible septal infarct   Nonspecific ST changes     Lipid Panel    Component Value Date/Time   CHOL  02/15/2009 0210    128        ATP III  CLASSIFICATION:  <200     mg/dL   Desirable  161-096  mg/dL   Borderline High  >=045    mg/dL   High          TRIG 409 02/15/2009 0210   HDL 34 (L) 02/15/2009 0210   CHOLHDL 3.8 02/15/2009 0210   VLDL 30 02/15/2009 0210   LDLCALC  02/15/2009 0210    64        Total Cholesterol/HDL:CHD Risk Coronary Heart Disease Risk Table                     Men   Women  1/2 Average Risk   3.4   3.3  Average Risk       5.0   4.4  2 X Average Risk   9.6   7.1  3 X Average Risk  23.4   11.0        Use the calculated Patient Ratio above and the CHD Risk Table to determine the patient's CHD Risk.        ATP III CLASSIFICATION (LDL):  <100     mg/dL   Optimal  811-914  mg/dL   Near or Above                    Optimal  130-159  mg/dL   Borderline  782-956  mg/dL   High  >213     mg/dL   Very High      Wt Readings from Last 3 Encounters:  10/25/16 110 lb 1.9 oz (50 kg)  08/21/15 100 lb (45.4 kg)  10/01/13 106 lb (48.1 kg)      ASSESSMENT AND PLAN:  1  CAD   Pt is a frail 83 yo with hx of CAD  Remote MI She is not very active  Limited by breathing   Cannot r/o that some limitation is cardiac  Still, no CP On exam, vol status is not bad I would recommm an echo to eval LV systolic /diastolic function.   I would not pursue other testing for now  2.  Pulm  Pt has first appt tomorrow    3  HL  LDL is good  Take activities as tolerated    F/U based on test results  Current medicines are reviewed at length with the patient today.  The patient does not have concerns regarding medicines.  Signed, Dietrich Pates, MD  10/25/2016 12:36 PM    [facility] [address] [place], [place], [place]  [address] Phone: [phone]; Fax: [phone]

## 2016-10-25 NOTE — Patient Instructions (Signed)

## 2016-10-26 ENCOUNTER — Ambulatory Visit (INDEPENDENT_AMBULATORY_CARE_PROVIDER_SITE_OTHER)
Admission: RE | Admit: 2016-10-26 | Discharge: 2016-10-26 | Disposition: A | Discharge status: Home / Self Care | Payer: Medicare HMO | Source: Ambulatory Visit | Attending: Pulmonary Disease | Admitting: Pulmonary Disease

## 2016-10-26 ENCOUNTER — Encounter: Payer: Self-pay | Admitting: Pulmonary Disease

## 2016-10-26 ENCOUNTER — Ambulatory Visit (INDEPENDENT_AMBULATORY_CARE_PROVIDER_SITE_OTHER): Payer: Medicare HMO | Admitting: Pulmonary Disease

## 2016-10-26 VITALS — BP 128/52 | HR 81 | Ht 60.0 in | Wt 113.2 lb

## 2016-10-26 DIAGNOSIS — J9611 Chronic respiratory failure with hypoxia: Secondary | ICD-10-CM

## 2016-10-26 DIAGNOSIS — J439 Emphysema, unspecified: Secondary | ICD-10-CM

## 2016-10-26 DIAGNOSIS — R413 Other amnesia: Secondary | ICD-10-CM | POA: Insufficient documentation

## 2016-10-26 DIAGNOSIS — Z23 Encounter for immunization: Secondary | ICD-10-CM

## 2016-10-26 DIAGNOSIS — J449 Chronic obstructive pulmonary disease, unspecified: Secondary | ICD-10-CM

## 2016-10-26 DIAGNOSIS — R05 Cough: Secondary | ICD-10-CM | POA: Diagnosis not present

## 2016-10-26 NOTE — Progress Notes (Signed)
Subjective:    Patient ID: Karen Hunter, female    DOB: [birth year], 83 y.o.   MRN: [id number]  HPI Patient was previously diagnosed with COPD and has known emphysema on CT imaging. She did see a Pulmonologist while she was in [place]. She is currently on a regimen of Duonebs tid and Pulmicort bid. She has been on this regimen since January of 2017 approximately. She was hospitalized in May 2017 with pneumonia. She was treated for pneumonia again November 2017. She reports she is only "semi" active. She does have intermittent dyspnea. She has decreased energy over the last couple of months. She has significant dyspnea now with transitioning from her bed to bathroom to living room. She reports intermittent coughing productive of a "cream to green" colored mucus. No audible wheezing. Previously had yearly bronchitis but hasn't had it recently. Last COPD exacerbation was in May with her pneumonia. No chest tightness, pain, or pressure. She does have some mildly abnormal feeling in her anterior chest. She has been on oxygen therapy since October 2016. She has been on 3 L/m since traveling from [place]. Denies any seasonal allergies, sinus congestion or drainage. No reflux, dyspepsia, or morning brash water taste. She does rarely use her rescue inhaler with exertion.   Review of Systems No abdominal pain, nausea, emesis, or diarrhea. No dysuria or hematuria. Does have pain in her thumb and left shoulder. A pertinent 14 point review of systems is negative except as per the history of presenting illness.  Allergies  Allergen Reactions  . Inh [Isoniazid] Other (See Comments)    hepatitis  . Naproxen Hives    Current Outpatient Prescriptions on File Prior to Visit  Medication Sig Dispense Refill  . alendronate (FOSAMAX) 70 MG tablet Take 70 mg by mouth once a week.    Marland Kitchen aspirin 81 MG tablet Take 81 mg by mouth daily.    . budesonide (PULMICORT) 0.5 MG/2ML nebulizer solution Take 0.5 mg by nebulization  as directed.     . Cholecalciferol (VITAMIN D-3 PO) Take 1 tablet by mouth daily.     Marland Kitchen dextromethorphan (DELSYM) 30 MG/5ML liquid Take 60 mg by mouth daily as needed for cough.    . donepezil (ARICEPT) 5 MG tablet Take 5 mg by mouth at bedtime.    Marland Kitchen escitalopram (LEXAPRO) 10 MG tablet Take 10 mg by mouth daily.    Marland Kitchen guaiFENesin (MUCINEX) 600 MG 12 hr tablet Take 2 tablets (1,200 mg total) by mouth 2 (two) times daily. 28 tablet 0  . guaiFENesin-dextromethorphan (ROBITUSSIN DM) 100-10 MG/5ML syrup Take 5 mLs by mouth every 4 (four) hours as needed for cough. 118 mL 0  . ipratropium-albuterol (DUONEB) 0.5-2.5 (3) MG/3ML SOLN 3 mLs as directed.     . metFORMIN (GLUCOPHAGE) 500 MG tablet Take 500 mg by mouth 2 (two) times daily with a meal.     . simvastatin (ZOCOR) 10 MG tablet Take 10 mg by mouth daily.    . traZODone (DESYREL) 50 MG tablet Take 50 mg by mouth at bedtime.     No current facility-administered medications on file prior to visit.     Past Medical History:  Diagnosis Date  . COPD (chronic obstructive pulmonary disease) (HCC)   . Coronary artery disease   . Diabetes mellitus without complication (HCC)   . Heart attack 1993  . Hepatitis    Inh-induced  . Hyperlipidemia   . Hypertension   . Short-term memory loss     Past  Surgical History:  Procedure Laterality Date  . ABDOMINAL HYSTERECTOMY    . LEFT HEART CATH  1993  . TONSILLECTOMY  age 89    Family History  Problem Relation Age of Onset  . Breast cancer Mother   . Lung disease Father   . Emphysema Father   . Breast cancer Maternal Grandmother     Social History   Social History  . Marital status: Married    Spouse name: N/A  . Number of children: 5  . Years of education: N/A   Social History Main Topics  . Smoking status: Former Smoker    Packs/day: 1.00    Years: 60.00    Types: Cigarettes    Start date: 10/16/1951    Quit date: 02/13/2016  . Smokeless tobacco: Never Used     Comment: She quit for  only months at time  . Alcohol use Yes     Comment: occ mixed drinks  . Drug use: No  . Sexual activity: Not Asked   Other Topics Concern  . None   Social History Narrative   [facility] (10/26/16):   Lives with her daughter currently. She is originally from [place]. She did live in [place] most of her life. She has also lived in [place]. 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 Remote parakeet exposure. No known asbestos exposure. Does have a living will. She hasn't officially named a Public affairs consultanthealthcare power of attorney.       Objective:   Physical Exam BP (!) 128/52 (BP Location: Right Arm, Cuff Size: Normal)   Pulse 81   Ht 5' (1.524 m)   Wt 113 lb 3.2 oz (51.3 kg)   SpO2 96%   BMI 22.11 kg/m  General:  Awake. Alert. No acute distress. Caucasian female.  Integument:  Warm & dry. No rash on exposed skin. No bruising on exposed skin. Extremities:  No cyanosis or clubbing.  Lymphatics:  No appreciated cervical or supraclavicular lymphadenoapthy. HEENT:  Moist mucus membranes. No oral ulcers. No scleral injection or icterus. No nasal turbinate swelling. Cardiovascular:  Regular rate. No edema. No appreciable JVD.  Pulmonary:  Symmetrically decreased breath sounds. Symmetric chest wall expansion. No accessory muscle use on room air and supplemental oxygen. Abdomen: Soft. Normal bowel sounds. Nondistended. Grossly nontender. Musculoskeletal:  Normal bulk and tone. Hand grip strength 5/5 bilaterally. No joint deformity or effusion appreciated. Neurological:  CN 2-12 grossly in tact. No meningismus. Moving all 4 extremities equally. Symmetric brachioradialis deep tendon reflexes. Psychiatric:  Mood and affect congruent. Speech normal rhythm, rate & tone. Oriented to person, place, year, president, and situation.  6MWT 10/26/16:  2 Laps Total / Baseline Saturation 91% on RA / Nadir Sat 81% on RA after 1 lap w/ chest discomfort (required 2 L/m  pulse from portable concentrator to maintain saturation)  IMAGING CXR PA/LAT 07/18/15 (personally reviewed by me): Flattening of the diaphragms with mild blunting of bilateral costophrenic angles. No pleural effusion appreciated. Heart normal in size & mediastinum normal in contour. No parenchymal mass or opacity appreciated.  CTA CHEST 02/15/09 (personally reviewed by me): Apical predominant emphysematous changes. Mild subpleural reticulation right greater than left. No obvious intralobular septal thickening. No pleural effusion or thickening. No pericardial effusion. Mild mediastinal lymph node calcification. No pathologically enlarged mediastinal lymph nodes. No pulmonary embolism.    Assessment & Plan:  83 y.o. female with COPD w/ emphysema & chronic hypoxic respiratory failure.The  severity of the patient's underlying COPD is difficult to establish at this time. She is continuing to have significant dyspnea on exertion. Symptomatically she seems to be well controlled. Today the patient only required oxygen at 2 L/m via pulse from her portable concentrator to maintain her saturation. Given her previous diagnoses of pneumonia we will be doing a chest x-ray today to ensure that she has resolution of her previous infiltrate/opacities. I had a lengthy discussion with the patient and her daughter today regarding her wishes for cardiopulmonary resuscitation and endotracheal intubation. The patient being ultrasound mind and reasoning would not wish to undergo cardiopulmonary resuscitation or endotracheal intubation. She does wish to receive treatment if she becomes ill and would be amenable to being hospitalized if necessary. She has no obvious memory loss at this time by my assessment. Reportedly the patient has discussed these wishes with all of her children and family members. Instructed the patient and her daughter to contact me if she had any further questions or concerns before her next  appointment.  1. COPD w/ Emphysema: Obtaining records from her previous pulmonologist. Continuing patient on Pulmicort nebulized twice daily and Duonebs 3 times a day. 2. Chronic Hypoxic Respiratory Failure: Prescribing the patient oxygen at 2 L/m with exertion. 3. Health Maintenance:  S/P Pneumovax 07 August 2015. Administering high-dose influenza vaccine today. 4. Code Status:  Patient reports a full DNR/DNI status and her daughter confirms this. They are going to formalize her health care power of attorney paperwork. 5. Follow-up:  Return to clinic in 6 weeks or sooner if needed.  Karen Hunter, M.D. [facility] Pulmonary & Critical Care Pager:  [phone] After 3pm or if no response, call [phone] 5:23 PM 10/26/16

## 2016-10-26 NOTE — Patient Instructions (Signed)
   Call me if you have any new breathing problems before your next appointment.  I want you to use oxygen at 2 L/m walking. You didn't need oxygen at rest.   Call me if you need any refills on your inhaler medications.  I will see you back in 6 weeks or sooner if needed.   TESTS ORDERED: 1. CXR PA/LAT Today

## 2016-10-30 DIAGNOSIS — M79602 Pain in left arm: Secondary | ICD-10-CM | POA: Diagnosis not present

## 2016-10-30 DIAGNOSIS — E784 Other hyperlipidemia: Secondary | ICD-10-CM | POA: Diagnosis not present

## 2016-10-30 DIAGNOSIS — J449 Chronic obstructive pulmonary disease, unspecified: Secondary | ICD-10-CM | POA: Diagnosis not present

## 2016-10-30 DIAGNOSIS — M79646 Pain in unspecified finger(s): Secondary | ICD-10-CM | POA: Diagnosis not present

## 2016-10-30 DIAGNOSIS — Z0000 Encounter for general adult medical examination without abnormal findings: Secondary | ICD-10-CM | POA: Diagnosis not present

## 2016-10-30 DIAGNOSIS — I25118 Atherosclerotic heart disease of native coronary artery with other forms of angina pectoris: Secondary | ICD-10-CM | POA: Diagnosis not present

## 2016-10-30 DIAGNOSIS — D649 Anemia, unspecified: Secondary | ICD-10-CM | POA: Diagnosis not present

## 2016-10-30 DIAGNOSIS — D692 Other nonthrombocytopenic purpura: Secondary | ICD-10-CM | POA: Diagnosis not present

## 2016-10-30 DIAGNOSIS — M6281 Muscle weakness (generalized): Secondary | ICD-10-CM | POA: Diagnosis not present

## 2016-11-05 ENCOUNTER — Other Ambulatory Visit: Payer: Self-pay | Admitting: Internal Medicine

## 2016-11-05 DIAGNOSIS — R0600 Dyspnea, unspecified: Secondary | ICD-10-CM

## 2016-11-07 ENCOUNTER — Ambulatory Visit
Admission: RE | Admit: 2016-11-07 | Discharge: 2016-11-07 | Disposition: A | Discharge status: Home / Self Care | Payer: Medicare HMO | Source: Ambulatory Visit | Attending: Internal Medicine | Admitting: Internal Medicine

## 2016-11-07 DIAGNOSIS — R05 Cough: Secondary | ICD-10-CM | POA: Diagnosis not present

## 2016-11-07 DIAGNOSIS — R0600 Dyspnea, unspecified: Secondary | ICD-10-CM

## 2016-11-08 ENCOUNTER — Telehealth: Payer: Self-pay | Admitting: Pulmonary Disease

## 2016-11-08 DIAGNOSIS — J449 Chronic obstructive pulmonary disease, unspecified: Secondary | ICD-10-CM

## 2016-11-08 NOTE — Telephone Encounter (Signed)
Spoke with pt's daughter, Elon JesterMichele. Pt is having some difficulty with only being able to use 2L of oxygen with exertion. Her oxygen level is staying around 90-91% on 2L with exertion. Elon JesterMichele would like to know when this happens can she increase the pt's oxygen to 2.5L, just to give the pt some peace of mind. Advised her that this would fine. Elon JesterMichele is also needing an [facility] to [facility] tanks. She stated that JN told her this would be taken care at her appointment. The order was not placed. This has been taken care of as of today. Nothing further was needed at this time.

## 2016-11-09 ENCOUNTER — Ambulatory Visit (INDEPENDENT_AMBULATORY_CARE_PROVIDER_SITE_OTHER): Payer: Self-pay | Admitting: Orthopaedic Surgery

## 2016-11-09 ENCOUNTER — Ambulatory Visit (HOSPITAL_COMMUNITY): Payer: Medicare HMO | Attending: Cardiovascular Disease

## 2016-11-09 ENCOUNTER — Other Ambulatory Visit: Payer: Self-pay

## 2016-11-09 DIAGNOSIS — I252 Old myocardial infarction: Secondary | ICD-10-CM

## 2016-11-09 DIAGNOSIS — I10 Essential (primary) hypertension: Secondary | ICD-10-CM | POA: Diagnosis not present

## 2016-11-09 DIAGNOSIS — E785 Hyperlipidemia, unspecified: Secondary | ICD-10-CM | POA: Insufficient documentation

## 2016-11-09 DIAGNOSIS — I2510 Atherosclerotic heart disease of native coronary artery without angina pectoris: Secondary | ICD-10-CM | POA: Insufficient documentation

## 2016-11-09 DIAGNOSIS — J449 Chronic obstructive pulmonary disease, unspecified: Secondary | ICD-10-CM | POA: Diagnosis not present

## 2016-11-09 DIAGNOSIS — R0602 Shortness of breath: Secondary | ICD-10-CM

## 2016-11-09 DIAGNOSIS — I071 Rheumatic tricuspid insufficiency: Secondary | ICD-10-CM | POA: Insufficient documentation

## 2016-11-09 DIAGNOSIS — J439 Emphysema, unspecified: Secondary | ICD-10-CM | POA: Diagnosis not present

## 2016-11-09 DIAGNOSIS — Z87891 Personal history of nicotine dependence: Secondary | ICD-10-CM | POA: Diagnosis not present

## 2016-11-09 DIAGNOSIS — I340 Nonrheumatic mitral (valve) insufficiency: Secondary | ICD-10-CM | POA: Diagnosis not present

## 2016-11-12 ENCOUNTER — Telehealth: Payer: Self-pay | Admitting: Pulmonary Disease

## 2016-11-12 ENCOUNTER — Ambulatory Visit (INDEPENDENT_AMBULATORY_CARE_PROVIDER_SITE_OTHER): Payer: Medicare HMO | Admitting: Orthopaedic Surgery

## 2016-11-12 ENCOUNTER — Ambulatory Visit (INDEPENDENT_AMBULATORY_CARE_PROVIDER_SITE_OTHER): Payer: Medicare HMO

## 2016-11-12 ENCOUNTER — Encounter (INDEPENDENT_AMBULATORY_CARE_PROVIDER_SITE_OTHER): Payer: Self-pay | Admitting: Orthopaedic Surgery

## 2016-11-12 DIAGNOSIS — M79645 Pain in left finger(s): Secondary | ICD-10-CM

## 2016-11-12 DIAGNOSIS — G8929 Other chronic pain: Secondary | ICD-10-CM | POA: Diagnosis not present

## 2016-11-12 DIAGNOSIS — M25512 Pain in left shoulder: Secondary | ICD-10-CM

## 2016-11-12 MED ORDER — DICLOFENAC SODIUM 1 % TD GEL: 2.0000 g | Freq: Four times a day (QID) | TRANSDERMAL | 5 refills | Status: DC

## 2016-11-12 MED ORDER — LIDOCAINE HCL 1 % IJ SOLN
0.3000 mL | INTRAMUSCULAR | Status: AC | PRN
  Administered 2016-11-12: .3 mL

## 2016-11-12 MED ORDER — BUPIVACAINE HCL 0.5 % IJ SOLN
3.0000 mL | INTRAMUSCULAR | Status: AC | PRN
  Administered 2016-11-12: 3 mL via INTRA_ARTICULAR

## 2016-11-12 MED ORDER — METHYLPREDNISOLONE ACETATE 40 MG/ML IJ SUSP
40.0000 mg | INTRAMUSCULAR | Status: AC | PRN
  Administered 2016-11-12: 40 mg via INTRA_ARTICULAR

## 2016-11-12 MED ORDER — METHYLPREDNISOLONE ACETATE 40 MG/ML IJ SUSP
13.3300 mg | INTRAMUSCULAR | Status: AC | PRN
  Administered 2016-11-12: 13.33 mg

## 2016-11-12 MED ORDER — BUPIVACAINE HCL 0.5 % IJ SOLN
0.3300 mL | INTRAMUSCULAR | Status: AC | PRN
  Administered 2016-11-12: .33 mL

## 2016-11-12 MED ORDER — LIDOCAINE HCL 1 % IJ SOLN
3.0000 mL | INTRAMUSCULAR | Status: AC | PRN
  Administered 2016-11-12: 3 mL

## 2016-11-12 NOTE — Telephone Encounter (Signed)
[facility] 48 pages to Dr. Jamison NeighborNestor

## 2016-11-12 NOTE — Progress Notes (Signed)
Office Visit Note   Patient: Karen Hunter           Date of Birth: [birth year]           MRN: [id number] Visit Date: 11/12/2016              Requested by: Alysia Penna, MD [address] [place], [place] [address] PCP: Alysia Penna, MD   Assessment & Plan: Visit Diagnoses:  1. Chronic left shoulder pain   2. Pain of left thumb     Plan: Patient has left shoulder impingement syndrome and left thumb basal joint arthritis. Subacromial injection and basal joint injection was performed today. Support orthotic for the left hand was applied today. She'll follow-up if not better. Voltaren gel was prescribed given patient's cardiac history.  Follow-Up Instructions: Return if symptoms worsen or fail to improve.   Orders:  Orders Placed This Encounter  Procedures  . XR Shoulder Left  . XR Finger Thumb Left   Meds ordered this encounter  Medications  . diclofenac sodium (VOLTAREN) 1 % GEL    Sig: Apply 2 g topically 4 (four) times daily.    Dispense:  1 Tube    Refill:  5      Procedures: Large Joint Inj Date/Time: 11/12/2016 6:48 PM Performed by: Tarry Kos Authorized by: Tarry Kos   Consent Given by:  Patient Timeout: prior to procedure the correct patient, procedure, and site was verified   Location:  Shoulder Site:  L subacromial bursa Prep: patient was prepped and draped in usual sterile fashion   Needle Size:  22 G Approach:  Posterior Ultrasound Guidance: No   Fluoroscopic Guidance: No   Arthrogram: No   Medications:  3 mL lidocaine 1 %; 3 mL bupivacaine 0.5 %; 40 mg methylPREDNISolone acetate 40 MG/ML Hand/UE Inj Date/Time: 11/12/2016 6:48 PM Performed by: Tarry Kos Authorized by: Tarry Kos   Consent Given by:  Patient Timeout: prior to procedure the correct patient, procedure, and site was verified   Indications:  Pain Condition: osteoarthritis   Location:  Thumb Thumb joint: CMC joint. Prep: patient was prepped and draped in usual  sterile fashion   Needle Size:  25 G Medications:  0.3 mL lidocaine 1 %; 0.33 mL bupivacaine 0.5 %; 13.33 mg methylPREDNISolone acetate 40 MG/ML     Clinical Data: No additional findings.   Subjective: Chief Complaint  Patient presents with  . Left Shoulder - Pain  . Left Thumb - Pain    Patient is a 83 year old female who comes in with left shoulder and thumb pain since November. Left thumb is worse with use of the hand and grasping objects. Pain is 6 out of 10. She is to use over-the-counter rubs with partial relief. With her left shoulder does hurts when she raises her arm and reaches back. Pain radiates up in the neck and down to the elbow. She denies any numbness or tingling. Advil gives partial relief. Denies any injuries. She is on chronic oxygen for COPD    Review of Systems Complete review of systems is negative except for history of present illness  Objective: Vital Signs: There were no vitals taken for this visit.  Physical Exam  Constitutional: She is oriented to person, place, and time. She appears well-developed and well-nourished.  HENT:  Head: Normocephalic and atraumatic.  Eyes: EOM are normal.  Neck: Neck supple.  Pulmonary/Chest: Effort normal.  Abdominal: Soft.  Neurological: She is alert and oriented to person,  place, and time.  Skin: Skin is warm. Capillary refill takes less than 2 seconds.  Psychiatric: She has a normal mood and affect. Her behavior is normal. Judgment and thought content normal.  Nursing note and vitals reviewed.   Ortho Exam Exam of the left shoulder shows positive impingement signs. Rotator cuff is grossly intact. Exam of the left hand shows positive grind test. No triggering. Specialty Comments:  No specialty comments available.  Imaging: Xr Finger Thumb Left  Result Date: 11/12/2016 Severe degenerative joint disease of the left thumb basal joint  Xr Shoulder Left  Result Date: 11/12/2016 Acromioclavicular joint  arthropathy. No glenohumeral disease. No structural abnormalities    PMFS History: Patient Active Problem List   Diagnosis Date Noted  . COPD (chronic obstructive pulmonary disease) (HCC) 10/26/2016  . Pulmonary emphysema (HCC) 10/26/2016  . Chronic respiratory failure (HCC) 10/26/2016  . Short-term memory loss 10/26/2016  . DM2 (diabetes mellitus, type 2) (HCC) 07/18/2015  . Coronary artery disease 10/01/2013  . Essential hypertension 10/01/2013  . Hyperlipidemia 10/01/2013   Past Medical History:  Diagnosis Date  . COPD (chronic obstructive pulmonary disease) (HCC)   . Coronary artery disease   . Diabetes mellitus without complication (HCC)   . Heart attack 1993  . Hepatitis    Inh-induced  . Hyperlipidemia   . Hypertension   . Short-term memory loss     Family History  Problem Relation Age of Onset  . Breast cancer Mother   . Lung disease Father   . Emphysema Father   . Breast cancer Maternal Grandmother     Past Surgical History:  Procedure Laterality Date  . ABDOMINAL HYSTERECTOMY    . LEFT HEART CATH  1993  . TONSILLECTOMY  age 51   Social History   Occupational History  . Not on file.   Social History Main Topics  . Smoking status: Former Smoker    Packs/day: 1.00    Years: 60.00    Types: Cigarettes    Start date: 10/16/1951    Quit date: 02/13/2016  . Smokeless tobacco: Never Used     Comment: She quit for only months at time  . Alcohol use Yes     Comment: occ mixed drinks  . Drug use: No  . Sexual activity: Not on file

## 2016-11-14 DIAGNOSIS — J449 Chronic obstructive pulmonary disease, unspecified: Secondary | ICD-10-CM | POA: Diagnosis not present

## 2016-11-14 DIAGNOSIS — E1165 Type 2 diabetes mellitus with hyperglycemia: Secondary | ICD-10-CM | POA: Diagnosis not present

## 2016-11-14 DIAGNOSIS — M25512 Pain in left shoulder: Secondary | ICD-10-CM | POA: Diagnosis not present

## 2016-11-14 DIAGNOSIS — M79645 Pain in left finger(s): Secondary | ICD-10-CM | POA: Diagnosis not present

## 2016-11-14 DIAGNOSIS — M4800 Spinal stenosis, site unspecified: Secondary | ICD-10-CM | POA: Diagnosis not present

## 2016-11-14 DIAGNOSIS — M6281 Muscle weakness (generalized): Secondary | ICD-10-CM | POA: Diagnosis not present

## 2016-11-14 DIAGNOSIS — I2510 Atherosclerotic heart disease of native coronary artery without angina pectoris: Secondary | ICD-10-CM | POA: Diagnosis not present

## 2016-11-14 DIAGNOSIS — I10 Essential (primary) hypertension: Secondary | ICD-10-CM | POA: Diagnosis not present

## 2016-11-14 DIAGNOSIS — E1140 Type 2 diabetes mellitus with diabetic neuropathy, unspecified: Secondary | ICD-10-CM | POA: Diagnosis not present

## 2016-11-15 DIAGNOSIS — D649 Anemia, unspecified: Secondary | ICD-10-CM | POA: Diagnosis not present

## 2016-11-16 DIAGNOSIS — J439 Emphysema, unspecified: Secondary | ICD-10-CM | POA: Diagnosis not present

## 2016-11-16 DIAGNOSIS — J449 Chronic obstructive pulmonary disease, unspecified: Secondary | ICD-10-CM | POA: Diagnosis not present

## 2016-11-17 DIAGNOSIS — J449 Chronic obstructive pulmonary disease, unspecified: Secondary | ICD-10-CM | POA: Diagnosis not present

## 2016-11-17 DIAGNOSIS — M79645 Pain in left finger(s): Secondary | ICD-10-CM | POA: Diagnosis not present

## 2016-11-17 DIAGNOSIS — M25512 Pain in left shoulder: Secondary | ICD-10-CM | POA: Diagnosis not present

## 2016-11-17 DIAGNOSIS — M6281 Muscle weakness (generalized): Secondary | ICD-10-CM | POA: Diagnosis not present

## 2016-11-17 DIAGNOSIS — M4800 Spinal stenosis, site unspecified: Secondary | ICD-10-CM | POA: Diagnosis not present

## 2016-11-17 DIAGNOSIS — E1140 Type 2 diabetes mellitus with diabetic neuropathy, unspecified: Secondary | ICD-10-CM | POA: Diagnosis not present

## 2016-11-20 DIAGNOSIS — M4800 Spinal stenosis, site unspecified: Secondary | ICD-10-CM | POA: Diagnosis not present

## 2016-11-20 DIAGNOSIS — M79645 Pain in left finger(s): Secondary | ICD-10-CM | POA: Diagnosis not present

## 2016-11-20 DIAGNOSIS — E1140 Type 2 diabetes mellitus with diabetic neuropathy, unspecified: Secondary | ICD-10-CM | POA: Diagnosis not present

## 2016-11-20 DIAGNOSIS — M25512 Pain in left shoulder: Secondary | ICD-10-CM | POA: Diagnosis not present

## 2016-11-20 DIAGNOSIS — M6281 Muscle weakness (generalized): Secondary | ICD-10-CM | POA: Diagnosis not present

## 2016-11-20 DIAGNOSIS — J440 Chronic obstructive pulmonary disease with acute lower respiratory infection: Secondary | ICD-10-CM | POA: Diagnosis not present

## 2016-11-20 DIAGNOSIS — J449 Chronic obstructive pulmonary disease, unspecified: Secondary | ICD-10-CM | POA: Diagnosis not present

## 2016-11-23 DIAGNOSIS — M25512 Pain in left shoulder: Secondary | ICD-10-CM | POA: Diagnosis not present

## 2016-11-23 DIAGNOSIS — M79645 Pain in left finger(s): Secondary | ICD-10-CM | POA: Diagnosis not present

## 2016-11-23 DIAGNOSIS — M6281 Muscle weakness (generalized): Secondary | ICD-10-CM | POA: Diagnosis not present

## 2016-11-23 DIAGNOSIS — M4800 Spinal stenosis, site unspecified: Secondary | ICD-10-CM | POA: Diagnosis not present

## 2016-11-23 DIAGNOSIS — J449 Chronic obstructive pulmonary disease, unspecified: Secondary | ICD-10-CM | POA: Diagnosis not present

## 2016-11-23 DIAGNOSIS — E1140 Type 2 diabetes mellitus with diabetic neuropathy, unspecified: Secondary | ICD-10-CM | POA: Diagnosis not present

## 2016-11-27 DIAGNOSIS — M4800 Spinal stenosis, site unspecified: Secondary | ICD-10-CM | POA: Diagnosis not present

## 2016-11-27 DIAGNOSIS — E1140 Type 2 diabetes mellitus with diabetic neuropathy, unspecified: Secondary | ICD-10-CM | POA: Diagnosis not present

## 2016-11-27 DIAGNOSIS — J449 Chronic obstructive pulmonary disease, unspecified: Secondary | ICD-10-CM | POA: Diagnosis not present

## 2016-11-27 DIAGNOSIS — M25512 Pain in left shoulder: Secondary | ICD-10-CM | POA: Diagnosis not present

## 2016-11-27 DIAGNOSIS — M79645 Pain in left finger(s): Secondary | ICD-10-CM | POA: Diagnosis not present

## 2016-11-27 DIAGNOSIS — M6281 Muscle weakness (generalized): Secondary | ICD-10-CM | POA: Diagnosis not present

## 2016-11-30 DIAGNOSIS — M6281 Muscle weakness (generalized): Secondary | ICD-10-CM | POA: Diagnosis not present

## 2016-11-30 DIAGNOSIS — J449 Chronic obstructive pulmonary disease, unspecified: Secondary | ICD-10-CM | POA: Diagnosis not present

## 2016-11-30 DIAGNOSIS — M4800 Spinal stenosis, site unspecified: Secondary | ICD-10-CM | POA: Diagnosis not present

## 2016-11-30 DIAGNOSIS — M25512 Pain in left shoulder: Secondary | ICD-10-CM | POA: Diagnosis not present

## 2016-11-30 DIAGNOSIS — M79645 Pain in left finger(s): Secondary | ICD-10-CM | POA: Diagnosis not present

## 2016-11-30 DIAGNOSIS — E1140 Type 2 diabetes mellitus with diabetic neuropathy, unspecified: Secondary | ICD-10-CM | POA: Diagnosis not present

## 2016-12-06 ENCOUNTER — Encounter: Payer: Self-pay | Admitting: Pulmonary Disease

## 2016-12-06 ENCOUNTER — Ambulatory Visit (INDEPENDENT_AMBULATORY_CARE_PROVIDER_SITE_OTHER): Payer: Medicare HMO | Admitting: Pulmonary Disease

## 2016-12-06 VITALS — BP 120/60 | HR 83 | Ht 60.0 in | Wt 111.6 lb

## 2016-12-06 DIAGNOSIS — J432 Centrilobular emphysema: Secondary | ICD-10-CM | POA: Diagnosis not present

## 2016-12-06 DIAGNOSIS — J9611 Chronic respiratory failure with hypoxia: Secondary | ICD-10-CM | POA: Diagnosis not present

## 2016-12-06 DIAGNOSIS — J449 Chronic obstructive pulmonary disease, unspecified: Secondary | ICD-10-CM

## 2016-12-06 NOTE — Addendum Note (Signed)
Addended by: Pamalee LeydenWIGGINS, Gorman Safi J on: 12/06/2016 04:36 PM   Modules accepted: Orders

## 2016-12-06 NOTE — Patient Instructions (Signed)
   Continue using your nebulizer medications as prescribed.  I want you to use your oxygen as needed with exertion.  We will send in a prescription for a stationary and portable concentrator through [facility].  Call me if you have any new breathing problems or questions before your next appointment.  I will see you back in 6 months or sooner if needed.

## 2016-12-06 NOTE — Progress Notes (Signed)
Subjective:    Patient ID: Karen Hunter, female    DOB: 12-24-1930, 83 y.o.   MRN: [id number]  C.C.:  Follow-up for COPD w/ Centrilobular Emphysema & Chronic Hypoxic Respiratory Failure.   HPI COPD w/ Centrilobular Emphysema: Early prescribed Pulmicort nebulized twice a day & Duonebs 3 times a day. Reports she has had intermittent coughing & wheezing. She is producing a "pale, green" mucus daily. Denies any hemoptysis. She reports her baseline dyspnea. She is doing physical therapy twice weekly.   Chronic Hypoxic Respiratory Failure: Currently prescribed oxygen at 2 L/m with exertion. She is using her portable concentrator. Daughter reports that she has required slightly more oxygen with exertion.    Review of Systems  No chest pain, pressure, or tightness recently. She does have intermittent joint & back pain with movement as well as positions. No fever, chills, or sweats.   Allergies  Allergen Reactions  . Inh [Isoniazid] Other (See Comments)    hepatitis  . Naproxen Hives    Current Outpatient Prescriptions on File Prior to Visit  Medication Sig Dispense Refill  . alendronate (FOSAMAX) 70 MG tablet Take 70 mg by mouth once a week.    Marland Kitchen aspirin 81 MG tablet Take 81 mg by mouth daily.    . budesonide (PULMICORT) 0.5 MG/2ML nebulizer solution Take 0.5 mg by nebulization as directed.     . Cholecalciferol (VITAMIN D-3 PO) Take 1 tablet by mouth daily.     Marland Kitchen dextromethorphan (DELSYM) 30 MG/5ML liquid Take 60 mg by mouth daily as needed for cough.    . diclofenac sodium (VOLTAREN) 1 % GEL Apply 2 g topically 4 (four) times daily. 1 Tube 5  . donepezil (ARICEPT) 5 MG tablet Take 5 mg by mouth at bedtime.    Marland Kitchen escitalopram (LEXAPRO) 10 MG tablet Take 10 mg by mouth daily.    Marland Kitchen guaiFENesin (MUCINEX) 600 MG 12 hr tablet Take 2 tablets (1,200 mg total) by mouth 2 (two) times daily. 28 tablet 0  . guaiFENesin-dextromethorphan (ROBITUSSIN DM) 100-10 MG/5ML syrup Take 5 mLs by mouth every 4  (four) hours as needed for cough. 118 mL 0  . ipratropium-albuterol (DUONEB) 0.5-2.5 (3) MG/3ML SOLN 3 mLs as directed.     . metFORMIN (GLUCOPHAGE) 500 MG tablet Take 500 mg by mouth 2 (two) times daily with a meal.     . simvastatin (ZOCOR) 10 MG tablet Take 10 mg by mouth daily.    . traZODone (DESYREL) 50 MG tablet Take 50 mg by mouth at bedtime.     No current facility-administered medications on file prior to visit.     Past Medical History:  Diagnosis Date  . COPD (chronic obstructive pulmonary disease) (HCC)   . Coronary artery disease   . Diabetes mellitus without complication (HCC)   . Heart attack 1993  . Hepatitis    Inh-induced  . Hyperlipidemia   . Hypertension   . Short-term memory loss     Past Surgical History:  Procedure Laterality Date  . ABDOMINAL HYSTERECTOMY    . LEFT HEART CATH  1993  . TONSILLECTOMY  age 39    Family History  Problem Relation Age of Onset  . Breast cancer Mother   . Lung disease Father   . Emphysema Father   . Breast cancer Maternal Grandmother     Social History   Social History  . Marital status: Widowed    Spouse name: N/A  . Number of children: 5  .  Years of education: N/A   Social History Main Topics  . Smoking status: Former Smoker    Packs/day: 1.00    Years: 60.00    Types: Cigarettes    Start date: 10/16/1951    Quit date: 02/13/2016  . Smokeless tobacco: Never Used     Comment: She quit for only months at time  . Alcohol use Yes     Comment: occ mixed drinks  . Drug use: No  . Sexual activity: Not Asked   Other Topics Concern  . None   Social History Narrative   [facility] (10/26/16):   Lives with her daughter currently. She is originally from [place]. She did live in [place] most of her life. She has also lived in [place]. 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 Remote parakeet exposure. No known asbestos exposure. Does have a living will. She hasn't  officially named a Public affairs consultanthealthcare power of attorney.       Objective:   Physical Exam BP 120/60 (BP Location: Right Arm, Patient Position: Sitting, Cuff Size: Normal)   Pulse 83   Ht 5' (1.524 m)   Wt 111 lb 9.6 oz (50.6 kg)   SpO2 96%   BMI 21.80 kg/m  Gen.: Elderly female. Comfortable. No distress. Accompanied by daughter today. Integument: Warm and dry. No rash or bruising on exposed skin. Abdomen: Soft. No tenderness to palpation. Nondistended. HEENT: Moist mucous membranes. No scleral icterus. No nasal turbinate swelling. Extremities: No cyanosis or clubbing. Cardiovascular: Regular rate. No edema. No appreciable JVD. Pulmonary: Good aeration bilaterally. Clear with auscultation. No accessory muscle use on nasal cannula oxygen via pulse concentrator.  6MWT 12/06/16:  Walked 2 laps total / Baseline Sat 94% on RA / Nadir Sat 81% on RA / Required 3 L/m pulse to maintain 91% Saturation 10/26/16:  Walked 2 laps total / Baseline Saturation 91% on RA / Nadir Sat 81% on RA after 1 lap w/ chest discomfort (required 2 L/m pulse from portable concentrator to maintain saturation)  IMAGING CT CHEST W/O 11/07/16 (personally reviewed by me):  Apical predominant centrilobular emphysema. Linear opacification with air bronchograms within the lingula and a small amount within right middle lobe indicative of previous scar tissue formation. No developing mass appreciated. No pleural effusion or thickening. No pathologic mediastinal adenopathy. No pericardial effusion.  CXR PA/LAT 07/18/15 (previously reviewed by me): Flattening of the diaphragms with mild blunting of bilateral costophrenic angles. No pleural effusion appreciated. Heart normal in size & mediastinum normal in contour. No parenchymal mass or opacity appreciated.  CTA CHEST 02/15/09 (previoiusly reviewed by me): Apical predominant emphysematous changes. Mild subpleural reticulation right greater than left. No obvious intralobular septal thickening.  No pleural effusion or thickening. No pericardial effusion. Mild mediastinal lymph node calcification. No pathologically enlarged mediastinal lymph nodes. No pulmonary embolism.  CARDIAC TTE (11/09/16): LV normal in size with EF 60-65%, normal regional wall motion, & normal diastolic function. LA & RA normal in size. RV normal in size and function. No aortic stenosis or regurgitation. Aortic root normal in size. Mild mitral regurgitation without stenosis. No significant pulmonic regurgitation. Trivial tricuspid regurgitation. No pericardial effusion.    Assessment & Plan:  83 y.o. female with underlying COPD with centrilobular emphysema & chronic hypoxic respiratory failure. I reviewed the patient's chest CT with both she and her daughter who was present today. The linear opacifications within the lingula and right middle lobe are most consistent  with scar tissue formation. She does continue to demonstrate an oxygen requirement and we will be switching over her oxygen therapy to a local DME company. Given her excellent symptom control I am continuing her current regimen but did caution her to notify me if she had any new breathing problems before her next appointment. I am awaiting records from her previous pulmonologist office.  1. COPD with centrilobular emphysema: Continuing patient on duonebs 3 times a day & Pulmicort nebulized twice daily. Awaiting records from her previous pulmonologist office. 2. Chronic hypoxic respiratory failure: Sending in new prescription to her local DME company for a portable and stationary concentrator. Continuing oxygen at 3 L/m pulse with exertion.  3. CODE STATUS: Previously endorsed a full DO NOT RESUSCITATE/DO NOT INTUBATE. 4. Health maintenance: Status post Pneumovax October 2016 & influenza vaccine January 2018. 5. Follow-up: Return to clinic in 6 months or sooner if needed.  Donna Christen Jamison Neighbor, M.D. [facility] Pulmonary & Critical Care Pager:  [phone] After 3pm  or if no response, call [phone] 4:01 PM 12/06/16

## 2016-12-07 DIAGNOSIS — E1140 Type 2 diabetes mellitus with diabetic neuropathy, unspecified: Secondary | ICD-10-CM | POA: Diagnosis not present

## 2016-12-07 DIAGNOSIS — M4800 Spinal stenosis, site unspecified: Secondary | ICD-10-CM | POA: Diagnosis not present

## 2016-12-07 DIAGNOSIS — M6281 Muscle weakness (generalized): Secondary | ICD-10-CM | POA: Diagnosis not present

## 2016-12-07 DIAGNOSIS — M79645 Pain in left finger(s): Secondary | ICD-10-CM | POA: Diagnosis not present

## 2016-12-07 DIAGNOSIS — J449 Chronic obstructive pulmonary disease, unspecified: Secondary | ICD-10-CM | POA: Diagnosis not present

## 2016-12-07 DIAGNOSIS — M25512 Pain in left shoulder: Secondary | ICD-10-CM | POA: Diagnosis not present

## 2016-12-11 DIAGNOSIS — M25512 Pain in left shoulder: Secondary | ICD-10-CM | POA: Diagnosis not present

## 2016-12-11 DIAGNOSIS — M4800 Spinal stenosis, site unspecified: Secondary | ICD-10-CM | POA: Diagnosis not present

## 2016-12-11 DIAGNOSIS — M6281 Muscle weakness (generalized): Secondary | ICD-10-CM | POA: Diagnosis not present

## 2016-12-11 DIAGNOSIS — M79645 Pain in left finger(s): Secondary | ICD-10-CM | POA: Diagnosis not present

## 2016-12-11 DIAGNOSIS — E1140 Type 2 diabetes mellitus with diabetic neuropathy, unspecified: Secondary | ICD-10-CM | POA: Diagnosis not present

## 2016-12-11 DIAGNOSIS — J449 Chronic obstructive pulmonary disease, unspecified: Secondary | ICD-10-CM | POA: Diagnosis not present

## 2016-12-13 DIAGNOSIS — E1140 Type 2 diabetes mellitus with diabetic neuropathy, unspecified: Secondary | ICD-10-CM | POA: Diagnosis not present

## 2016-12-13 DIAGNOSIS — M6281 Muscle weakness (generalized): Secondary | ICD-10-CM | POA: Diagnosis not present

## 2016-12-13 DIAGNOSIS — M79645 Pain in left finger(s): Secondary | ICD-10-CM | POA: Diagnosis not present

## 2016-12-13 DIAGNOSIS — M4800 Spinal stenosis, site unspecified: Secondary | ICD-10-CM | POA: Diagnosis not present

## 2016-12-13 DIAGNOSIS — M25512 Pain in left shoulder: Secondary | ICD-10-CM | POA: Diagnosis not present

## 2016-12-13 DIAGNOSIS — J449 Chronic obstructive pulmonary disease, unspecified: Secondary | ICD-10-CM | POA: Diagnosis not present

## 2016-12-14 DIAGNOSIS — J449 Chronic obstructive pulmonary disease, unspecified: Secondary | ICD-10-CM | POA: Diagnosis not present

## 2016-12-14 DIAGNOSIS — J439 Emphysema, unspecified: Secondary | ICD-10-CM | POA: Diagnosis not present

## 2016-12-18 DIAGNOSIS — M25512 Pain in left shoulder: Secondary | ICD-10-CM | POA: Diagnosis not present

## 2016-12-18 DIAGNOSIS — J440 Chronic obstructive pulmonary disease with acute lower respiratory infection: Secondary | ICD-10-CM | POA: Diagnosis not present

## 2016-12-18 DIAGNOSIS — M79645 Pain in left finger(s): Secondary | ICD-10-CM | POA: Diagnosis not present

## 2016-12-18 DIAGNOSIS — J449 Chronic obstructive pulmonary disease, unspecified: Secondary | ICD-10-CM | POA: Diagnosis not present

## 2016-12-18 DIAGNOSIS — E1140 Type 2 diabetes mellitus with diabetic neuropathy, unspecified: Secondary | ICD-10-CM | POA: Diagnosis not present

## 2016-12-18 DIAGNOSIS — M4800 Spinal stenosis, site unspecified: Secondary | ICD-10-CM | POA: Diagnosis not present

## 2016-12-18 DIAGNOSIS — M6281 Muscle weakness (generalized): Secondary | ICD-10-CM | POA: Diagnosis not present

## 2016-12-20 DIAGNOSIS — M79645 Pain in left finger(s): Secondary | ICD-10-CM | POA: Diagnosis not present

## 2016-12-20 DIAGNOSIS — M25512 Pain in left shoulder: Secondary | ICD-10-CM | POA: Diagnosis not present

## 2016-12-20 DIAGNOSIS — M4800 Spinal stenosis, site unspecified: Secondary | ICD-10-CM | POA: Diagnosis not present

## 2016-12-20 DIAGNOSIS — J449 Chronic obstructive pulmonary disease, unspecified: Secondary | ICD-10-CM | POA: Diagnosis not present

## 2016-12-20 DIAGNOSIS — M6281 Muscle weakness (generalized): Secondary | ICD-10-CM | POA: Diagnosis not present

## 2016-12-20 DIAGNOSIS — E1140 Type 2 diabetes mellitus with diabetic neuropathy, unspecified: Secondary | ICD-10-CM | POA: Diagnosis not present

## 2016-12-25 DIAGNOSIS — M4800 Spinal stenosis, site unspecified: Secondary | ICD-10-CM | POA: Diagnosis not present

## 2016-12-25 DIAGNOSIS — M79645 Pain in left finger(s): Secondary | ICD-10-CM | POA: Diagnosis not present

## 2016-12-25 DIAGNOSIS — J449 Chronic obstructive pulmonary disease, unspecified: Secondary | ICD-10-CM | POA: Diagnosis not present

## 2016-12-25 DIAGNOSIS — E1140 Type 2 diabetes mellitus with diabetic neuropathy, unspecified: Secondary | ICD-10-CM | POA: Diagnosis not present

## 2016-12-25 DIAGNOSIS — M6281 Muscle weakness (generalized): Secondary | ICD-10-CM | POA: Diagnosis not present

## 2016-12-25 DIAGNOSIS — M25512 Pain in left shoulder: Secondary | ICD-10-CM | POA: Diagnosis not present

## 2016-12-27 DIAGNOSIS — M79645 Pain in left finger(s): Secondary | ICD-10-CM | POA: Diagnosis not present

## 2016-12-27 DIAGNOSIS — M4800 Spinal stenosis, site unspecified: Secondary | ICD-10-CM | POA: Diagnosis not present

## 2016-12-27 DIAGNOSIS — M25512 Pain in left shoulder: Secondary | ICD-10-CM | POA: Diagnosis not present

## 2016-12-27 DIAGNOSIS — M6281 Muscle weakness (generalized): Secondary | ICD-10-CM | POA: Diagnosis not present

## 2016-12-27 DIAGNOSIS — E1140 Type 2 diabetes mellitus with diabetic neuropathy, unspecified: Secondary | ICD-10-CM | POA: Diagnosis not present

## 2016-12-27 DIAGNOSIS — J449 Chronic obstructive pulmonary disease, unspecified: Secondary | ICD-10-CM | POA: Diagnosis not present

## 2017-01-01 DIAGNOSIS — M6281 Muscle weakness (generalized): Secondary | ICD-10-CM | POA: Diagnosis not present

## 2017-01-01 DIAGNOSIS — M4800 Spinal stenosis, site unspecified: Secondary | ICD-10-CM | POA: Diagnosis not present

## 2017-01-01 DIAGNOSIS — M25512 Pain in left shoulder: Secondary | ICD-10-CM | POA: Diagnosis not present

## 2017-01-01 DIAGNOSIS — E1140 Type 2 diabetes mellitus with diabetic neuropathy, unspecified: Secondary | ICD-10-CM | POA: Diagnosis not present

## 2017-01-01 DIAGNOSIS — J449 Chronic obstructive pulmonary disease, unspecified: Secondary | ICD-10-CM | POA: Diagnosis not present

## 2017-01-01 DIAGNOSIS — M79645 Pain in left finger(s): Secondary | ICD-10-CM | POA: Diagnosis not present

## 2017-01-03 DIAGNOSIS — E1140 Type 2 diabetes mellitus with diabetic neuropathy, unspecified: Secondary | ICD-10-CM | POA: Diagnosis not present

## 2017-01-03 DIAGNOSIS — M79645 Pain in left finger(s): Secondary | ICD-10-CM | POA: Diagnosis not present

## 2017-01-03 DIAGNOSIS — M25512 Pain in left shoulder: Secondary | ICD-10-CM | POA: Diagnosis not present

## 2017-01-03 DIAGNOSIS — J449 Chronic obstructive pulmonary disease, unspecified: Secondary | ICD-10-CM | POA: Diagnosis not present

## 2017-01-03 DIAGNOSIS — M6281 Muscle weakness (generalized): Secondary | ICD-10-CM | POA: Diagnosis not present

## 2017-01-03 DIAGNOSIS — M4800 Spinal stenosis, site unspecified: Secondary | ICD-10-CM | POA: Diagnosis not present

## 2017-01-08 ENCOUNTER — Telehealth: Payer: Self-pay | Admitting: Pulmonary Disease

## 2017-01-08 DIAGNOSIS — J449 Chronic obstructive pulmonary disease, unspecified: Secondary | ICD-10-CM

## 2017-01-08 NOTE — Telephone Encounter (Signed)
Spoke with pt's daughter Marcelino DusterMichelle, requesting an order to be placed for a pulse dosing 02 concentrator- DME: [facility].  Requesting specifically a ML6 tank.    JN ok to order eval for this tank?  Thanks!

## 2017-01-09 DIAGNOSIS — J440 Chronic obstructive pulmonary disease with acute lower respiratory infection: Secondary | ICD-10-CM | POA: Diagnosis not present

## 2017-01-09 NOTE — Telephone Encounter (Signed)
Please place an order for a portable concentrator to be used at 3 L/m pulse with exertion. Thanks.

## 2017-01-09 NOTE — Telephone Encounter (Signed)
Spoke with daughter and made her aware of JN message. The order was placed. Nothing further is needed

## 2017-01-14 DIAGNOSIS — J449 Chronic obstructive pulmonary disease, unspecified: Secondary | ICD-10-CM | POA: Diagnosis not present

## 2017-01-14 DIAGNOSIS — J439 Emphysema, unspecified: Secondary | ICD-10-CM | POA: Diagnosis not present

## 2017-01-18 DIAGNOSIS — J440 Chronic obstructive pulmonary disease with acute lower respiratory infection: Secondary | ICD-10-CM | POA: Diagnosis not present

## 2017-01-29 DIAGNOSIS — K5909 Other constipation: Secondary | ICD-10-CM | POA: Diagnosis not present

## 2017-01-29 DIAGNOSIS — E1165 Type 2 diabetes mellitus with hyperglycemia: Secondary | ICD-10-CM | POA: Diagnosis not present

## 2017-01-29 DIAGNOSIS — D649 Anemia, unspecified: Secondary | ICD-10-CM | POA: Diagnosis not present

## 2017-01-29 DIAGNOSIS — I10 Essential (primary) hypertension: Secondary | ICD-10-CM | POA: Diagnosis not present

## 2017-01-29 DIAGNOSIS — R5383 Other fatigue: Secondary | ICD-10-CM | POA: Diagnosis not present

## 2017-01-29 DIAGNOSIS — F17200 Nicotine dependence, unspecified, uncomplicated: Secondary | ICD-10-CM | POA: Diagnosis not present

## 2017-01-29 DIAGNOSIS — R1319 Other dysphagia: Secondary | ICD-10-CM | POA: Diagnosis not present

## 2017-01-29 DIAGNOSIS — J449 Chronic obstructive pulmonary disease, unspecified: Secondary | ICD-10-CM | POA: Diagnosis not present

## 2017-01-29 DIAGNOSIS — E860 Dehydration: Secondary | ICD-10-CM | POA: Diagnosis not present

## 2017-02-11 ENCOUNTER — Ambulatory Visit (HOSPITAL_COMMUNITY): Payer: Medicare HMO

## 2017-02-13 DIAGNOSIS — J439 Emphysema, unspecified: Secondary | ICD-10-CM | POA: Diagnosis not present

## 2017-02-13 DIAGNOSIS — J449 Chronic obstructive pulmonary disease, unspecified: Secondary | ICD-10-CM | POA: Diagnosis not present

## 2017-02-19 ENCOUNTER — Encounter (HOSPITAL_COMMUNITY): Payer: Medicare HMO

## 2017-03-04 ENCOUNTER — Other Ambulatory Visit (HOSPITAL_COMMUNITY): Payer: Self-pay | Admitting: *Deleted

## 2017-03-05 ENCOUNTER — Ambulatory Visit (HOSPITAL_COMMUNITY)
Admission: RE | Admit: 2017-03-05 | Discharge: 2017-03-05 | Disposition: A | Discharge status: Home / Self Care | Payer: Medicare HMO | Source: Ambulatory Visit | Attending: Internal Medicine | Admitting: Internal Medicine

## 2017-03-05 DIAGNOSIS — D649 Anemia, unspecified: Secondary | ICD-10-CM | POA: Diagnosis not present

## 2017-03-05 MED ORDER — SODIUM CHLORIDE 0.9 % IV SOLN
510.0000 mg | INTRAVENOUS | Status: DC
  Administered 2017-03-05: 510 mg via INTRAVENOUS
  Filled 2017-03-05: qty 17

## 2017-03-05 NOTE — Discharge Instructions (Signed)

## 2017-03-13 ENCOUNTER — Ambulatory Visit (HOSPITAL_COMMUNITY)
Admission: RE | Admit: 2017-03-13 | Discharge: 2017-03-13 | Disposition: A | Discharge status: Home / Self Care | Payer: Medicare HMO | Source: Ambulatory Visit | Attending: Internal Medicine | Admitting: Internal Medicine

## 2017-03-13 DIAGNOSIS — D649 Anemia, unspecified: Secondary | ICD-10-CM | POA: Insufficient documentation

## 2017-03-13 MED ORDER — SODIUM CHLORIDE 0.9 % IV SOLN
510.0000 mg | INTRAVENOUS | Status: DC
  Administered 2017-03-13: 510 mg via INTRAVENOUS
  Filled 2017-03-13: qty 17

## 2017-03-30 DIAGNOSIS — J449 Chronic obstructive pulmonary disease, unspecified: Secondary | ICD-10-CM | POA: Diagnosis not present

## 2017-04-18 ENCOUNTER — Other Ambulatory Visit (INDEPENDENT_AMBULATORY_CARE_PROVIDER_SITE_OTHER): Payer: Self-pay

## 2017-04-18 ENCOUNTER — Telehealth (INDEPENDENT_AMBULATORY_CARE_PROVIDER_SITE_OTHER): Payer: Self-pay | Admitting: Orthopaedic Surgery

## 2017-04-18 MED ORDER — DICLOFENAC SODIUM 1 % TD GEL: 2.0000 g | Freq: Four times a day (QID) | TRANSDERMAL | 1 refills | Status: AC

## 2017-04-18 NOTE — Telephone Encounter (Signed)
PT WANTS TO KNOW IF HER VOLTUREN GEL CAN BE SENT THROUGH [facility].  [phone]

## 2017-04-18 NOTE — Telephone Encounter (Signed)
Sent to [employer]

## 2017-04-29 DIAGNOSIS — J449 Chronic obstructive pulmonary disease, unspecified: Secondary | ICD-10-CM | POA: Diagnosis not present

## 2017-05-01 DIAGNOSIS — D509 Iron deficiency anemia, unspecified: Secondary | ICD-10-CM | POA: Diagnosis not present

## 2017-05-01 DIAGNOSIS — R5383 Other fatigue: Secondary | ICD-10-CM | POA: Diagnosis not present

## 2017-05-01 DIAGNOSIS — D692 Other nonthrombocytopenic purpura: Secondary | ICD-10-CM | POA: Diagnosis not present

## 2017-05-01 DIAGNOSIS — M859 Disorder of bone density and structure, unspecified: Secondary | ICD-10-CM | POA: Diagnosis not present

## 2017-05-01 DIAGNOSIS — I10 Essential (primary) hypertension: Secondary | ICD-10-CM | POA: Diagnosis not present

## 2017-05-01 DIAGNOSIS — H04129 Dry eye syndrome of unspecified lacrimal gland: Secondary | ICD-10-CM | POA: Diagnosis not present

## 2017-05-01 DIAGNOSIS — Z87891 Personal history of nicotine dependence: Secondary | ICD-10-CM | POA: Diagnosis not present

## 2017-05-01 DIAGNOSIS — E1165 Type 2 diabetes mellitus with hyperglycemia: Secondary | ICD-10-CM | POA: Diagnosis not present

## 2017-05-01 DIAGNOSIS — D508 Other iron deficiency anemias: Secondary | ICD-10-CM | POA: Diagnosis not present

## 2017-05-01 DIAGNOSIS — M6281 Muscle weakness (generalized): Secondary | ICD-10-CM | POA: Diagnosis not present

## 2017-05-01 DIAGNOSIS — J449 Chronic obstructive pulmonary disease, unspecified: Secondary | ICD-10-CM | POA: Diagnosis not present

## 2017-05-30 DIAGNOSIS — J449 Chronic obstructive pulmonary disease, unspecified: Secondary | ICD-10-CM | POA: Diagnosis not present

## 2017-06-04 IMAGING — DX DG CHEST 2V
2 series · 2 of 2 positions shown · non-contrast
Comparison: CT of the chest dated 02/15/2009

CLINICAL DATA: Shortness of breath and cough for 4 days.

EXAM:
CHEST  2 VIEW

[chest lat]
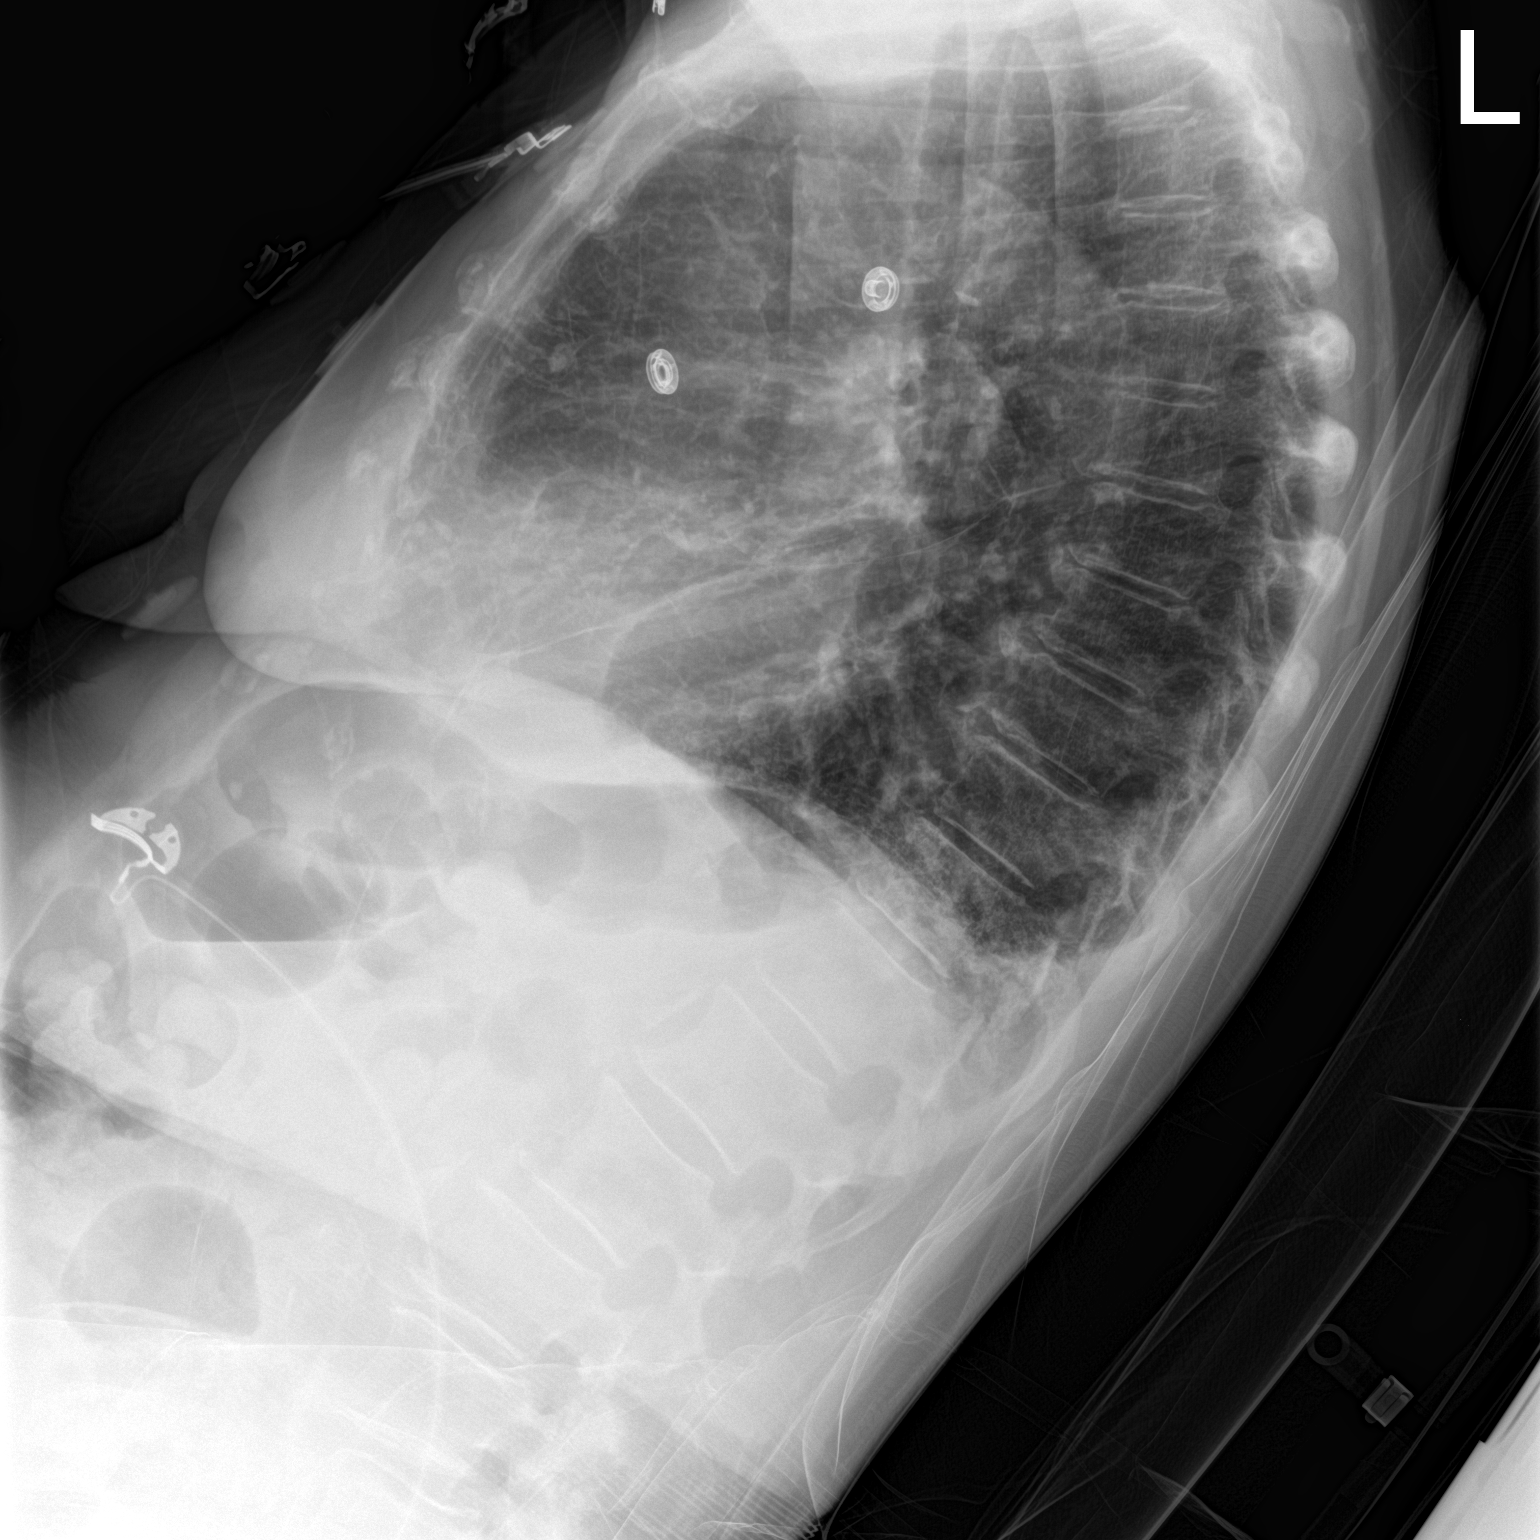

[chest ap]
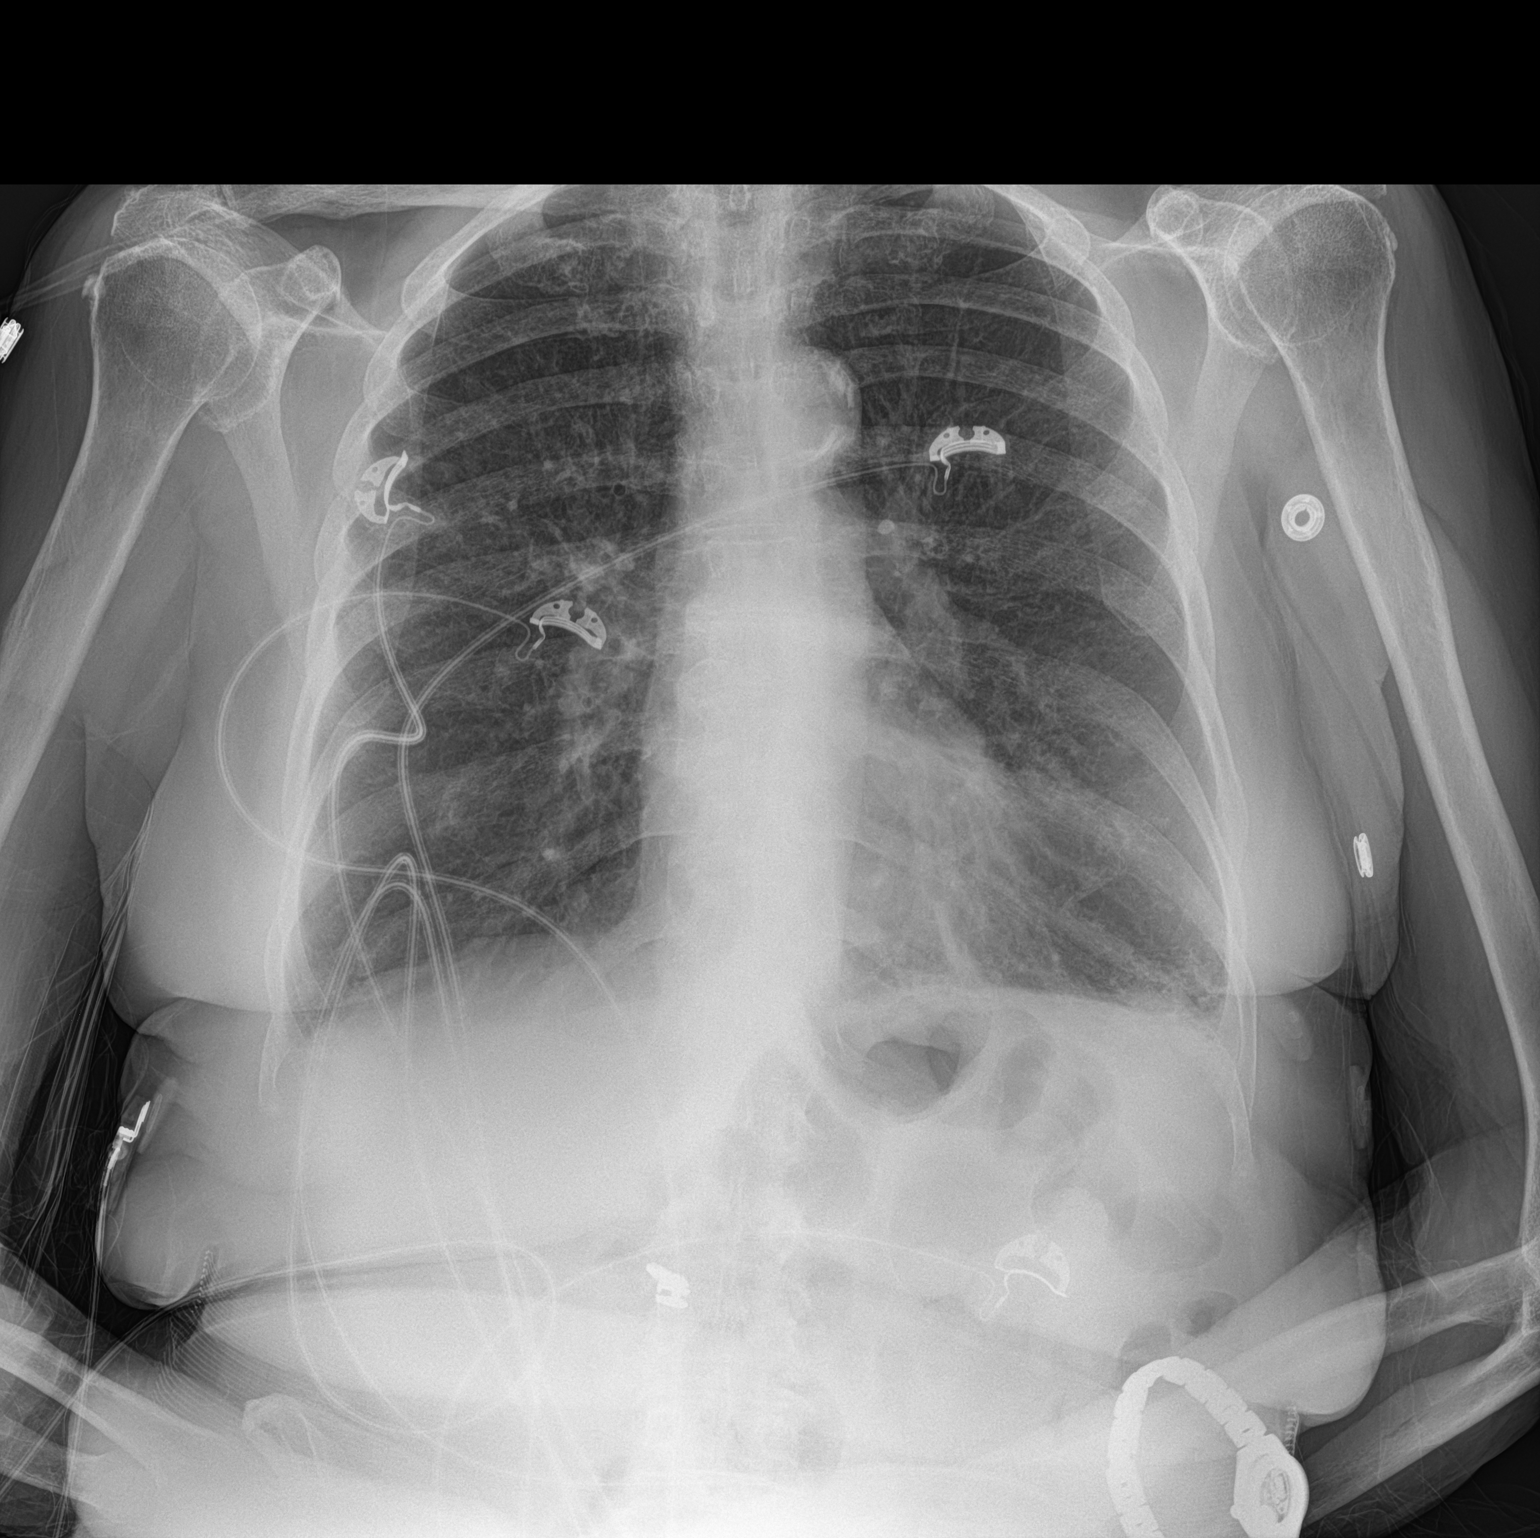

[2 of 2 positions shown; findings below may reference images not displayed]

FINDINGS: Cardiomediastinal silhouette is normal. Mediastinal contours appear
intact. Atherosclerotic calcifications of the aortic arch is seen.

There is no evidence of focal airspace consolidation or
pneumothorax. There are bilateral lower lobe predominant increased
interstitial markings. There are bilateral small pleural effusions.

Osseous structures are without acute abnormality. Soft tissues are
grossly normal.
IMPRESSION: Emphysematous changes with superimposed mild pulmonary edema with
bilateral pleural effusions.

## 2017-06-12 ENCOUNTER — Ambulatory Visit: Payer: Medicare HMO | Admitting: Pulmonary Disease

## 2017-06-30 DIAGNOSIS — J449 Chronic obstructive pulmonary disease, unspecified: Secondary | ICD-10-CM | POA: Diagnosis not present

## 2017-07-02 ENCOUNTER — Encounter: Payer: Self-pay | Admitting: Pulmonary Disease

## 2017-07-02 ENCOUNTER — Ambulatory Visit (INDEPENDENT_AMBULATORY_CARE_PROVIDER_SITE_OTHER): Payer: Medicare HMO | Admitting: Pulmonary Disease

## 2017-07-02 VITALS — BP 118/64 | HR 92 | Ht 60.0 in | Wt 109.4 lb

## 2017-07-02 DIAGNOSIS — Z23 Encounter for immunization: Secondary | ICD-10-CM

## 2017-07-02 DIAGNOSIS — J432 Centrilobular emphysema: Secondary | ICD-10-CM

## 2017-07-02 DIAGNOSIS — J9611 Chronic respiratory failure with hypoxia: Secondary | ICD-10-CM | POA: Diagnosis not present

## 2017-07-02 MED ORDER — FORMOTEROL FUMARATE 20 MCG/2ML IN NEBU: 20.0000 ug | INHALATION_SOLUTION | Freq: Two times a day (BID) | RESPIRATORY_TRACT | 6 refills | Status: DC

## 2017-07-02 NOTE — Patient Instructions (Addendum)
   Continue using your Budesonide twice daily.  We are keeping you on your current regimen of Duoneb 3 times daily.  I'm starting you on a medication called Perforomist that you will take twice daily, at the same time as your Budesonide.  Remember to get the High Dose Flu Shot in October.  We will see you back in 6 months or sooner if needed. Call our office if you need anything.

## 2017-07-02 NOTE — Progress Notes (Signed)
Subjective:    Patient ID: Karen Hunter, female    DOB: 11/08/1930, 83 y.o.   MRN: [id number]  C.C.:  Follow-up for COPD w/ Centrilobular Emphysema & Chronic Hypoxic Respiratory Failure.   HPI COPD with centrilobular emphysema: Prescribed Pulmicort twice daily & Duonebs 3 times a day. Awaiting records from her previous pulmonologist's office. Patient and daughter reports she does have some coughing but mostly with swallowing problems. She does cough some in bed at night. Denies any wheezing. She does feel her dyspnea may be somewhat worse, especially with activity. She is adherent to her regimen. She has rarely needed her rescue Albuterol. No exacerbations since last appointment.   Chronic hypoxic respiratory failure: Prescribed oxygen at 3 L/m pulse with exertion. Prescription for portable concentrator sent to DME company which she now has.   Review of Systems  She does have some trouble swallowing, especially salads and pills. No chest pain or pressor. No fever or chills.   Allergies  Allergen Reactions  . Inh [Isoniazid] Other (See Comments)    hepatitis  . Naproxen Hives    Current Outpatient Prescriptions on File Prior to Visit  Medication Sig Dispense Refill  . alendronate (FOSAMAX) 70 MG tablet Take 70 mg by mouth once a week.    Marland Kitchen aspirin 81 MG tablet Take 81 mg by mouth daily.    . budesonide (PULMICORT) 0.5 MG/2ML nebulizer solution Take 0.5 mg by nebulization as directed.     Marland Kitchen dextromethorphan (DELSYM) 30 MG/5ML liquid Take 60 mg by mouth daily as needed for cough.    . diclofenac sodium (VOLTAREN) 1 % GEL Apply 2 g topically 4 (four) times daily. 5 Tube 1  . donepezil (ARICEPT) 5 MG tablet Take 5 mg by mouth at bedtime.    Marland Kitchen escitalopram (LEXAPRO) 10 MG tablet Take 10 mg by mouth daily.    Marland Kitchen guaiFENesin (MUCINEX) 600 MG 12 hr tablet Take 2 tablets (1,200 mg total) by mouth 2 (two) times daily. 28 tablet 0  . guaiFENesin-dextromethorphan (ROBITUSSIN DM) 100-10 MG/5ML  syrup Take 5 mLs by mouth every 4 (four) hours as needed for cough. 118 mL 0  . ipratropium-albuterol (DUONEB) 0.5-2.5 (3) MG/3ML SOLN 3 mLs as directed.     . metFORMIN (GLUCOPHAGE) 500 MG tablet Take 500 mg by mouth 2 (two) times daily with a meal.     . simvastatin (ZOCOR) 10 MG tablet Take 10 mg by mouth daily.    . traZODone (DESYREL) 50 MG tablet Take 50 mg by mouth at bedtime.     No current facility-administered medications on file prior to visit.     Past Medical History:  Diagnosis Date  . COPD (chronic obstructive pulmonary disease) (HCC)   . Coronary artery disease   . Diabetes mellitus without complication (HCC)   . Heart attack (HCC) 1993  . Hepatitis    Inh-induced  . Hyperlipidemia   . Hypertension   . Short-term memory loss     Past Surgical History:  Procedure Laterality Date  . ABDOMINAL HYSTERECTOMY    . LEFT HEART CATH  1993  . TONSILLECTOMY  age 77    Family History  Problem Relation Age of Onset  . Breast cancer Mother   . Lung disease Father   . Emphysema Father   . Breast cancer Maternal Grandmother     Social History   Social History  . Marital status: Widowed    Spouse name: N/A  . Number of children: 5  .  Years of education: N/A   Social History Main Topics  . Smoking status: Former Smoker    Packs/day: 1.00    Years: 60.00    Types: Cigarettes    Start date: 10/16/1951    Quit date: 02/13/2016  . Smokeless tobacco: Never Used     Comment: She quit for only months at time  . Alcohol use Yes     Comment: occ mixed drinks  . Drug use: No  . Sexual activity: Not Asked   Other Topics Concern  . None   Social History Narrative   [facility] (10/26/16):   Lives with her daughter currently. She is originally from [place]. She did live in [place] most of her life. She has also lived in [place]. 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 Remote parakeet exposure. No known asbestos exposure.  Does have a living will. She hasn't officially named a Public affairs consultant of attorney.       Objective:   Physical Exam BP 118/64 (BP Location: Left Arm, Cuff Size: Normal)   Pulse 92   Ht 5' (1.524 m)   Wt 109 lb 6 oz (49.6 kg)   SpO2 94%   BMI 21.36 kg/m   General:  Awake. Elderly female. Accompanied by daughter today. Integument:  Warm & dry. No rash on exposed skin. No bruising on exposed skin. Extremities:  No cyanosis.  HEENT:  Moist mucus membranes. No scleral icterus. No oral ulcers Cardiovascular:  Regular rate. No edema. Unable to appreciate JVD.  Pulmonary:  Slightly decreased breath sounds in the bases. Otherwise good aeration. Normal breathing on supplemental oxygen. Abdomen: Soft. Normal bowel sounds. Mildly protuberant. Musculoskeletal:  Normal bulk and tone. No joint deformity or effusion appreciated.  12/06/16:  Walked 2 laps total / Baseline Sat 94% on RA / Nadir Sat 81% on RA / Required 3 L/m pulse to maintain 91% Saturation 10/26/16:  Walked 2 laps total / Baseline Saturation 91% on RA / Nadir Sat 81% on RA after 1 lap w/ chest discomfort (required 2 L/m pulse from portable concentrator to maintain saturation)  IMAGING CT CHEST W/O 11/07/16 (previously reviewed by me):  Apical predominant centrilobular emphysema. Linear opacification with air bronchograms within the lingula and a small amount within right middle lobe indicative of previous scar tissue formation. No developing mass appreciated. No pleural effusion or thickening. No pathologic mediastinal adenopathy. No pericardial effusion.  CXR PA/LAT 07/18/15 (previously reviewed by me): Flattening of the diaphragms with mild blunting of bilateral costophrenic angles. No pleural effusion appreciated. Heart normal in size & mediastinum normal in contour. No parenchymal mass or opacity appreciated.  CTA CHEST 02/15/09 (previoiusly reviewed by me): Apical predominant emphysematous changes. Mild subpleural reticulation  right greater than left. No obvious intralobular septal thickening. No pleural effusion or thickening. No pericardial effusion. Mild mediastinal lymph node calcification. No pathologically enlarged mediastinal lymph nodes. No pulmonary embolism.  CARDIAC TTE (11/09/16): LV normal in size with EF 60-65%, normal regional wall motion, & normal diastolic function. LA & RA normal in size. RV normal in size and function. No aortic stenosis or regurgitation. Aortic root normal in size. Mild mitral regurgitation without stenosis. No significant pulmonic regurgitation. Trivial tricuspid regurgitation. No pericardial effusion.    Assessment & Plan:  83 y.o. female previously diagnosed with COPD with known centrilobular emphysema and chronic hypoxic respiratory failure. Patient is still using her Duonebs 3 times a day and somewhat  inactive due to weather induced symptoms. I believe adding a nebulized LABA and eventually a nebulized LAMA may help to better control her symptoms and improve her ease of use as she does take to use her nebulizer 3 times daily. I instructed the patient and her daughter to contact me if she had any new breathing problems or questions before her next appointment.  1. COPD with centrilobular emphysema:  Continuing patient on Pulmicort twice daily & Duonebs 3 times a day. Starting Perforomist twice daily. Plan to consider nebulized LAMA at next appointment. 2. Chronic hypoxic respiratory failure: Continuing on oxygen as previously prescribed with portable concentrator. 3. Health maintenance:  Status post Pneumovax October 2016. Administering Prevnar vaccine today. Recommended high-dose influenza vaccine in October. 4. Follow-up: Return to clinic in 6 months or sooner if needed.  Donna Christen Jamison Neighbor, M.D. [facility] Pager:  [phone] After 3pm or if no response, call [phone] 1:53 PM 07/02/17

## 2017-07-03 ENCOUNTER — Telehealth: Payer: Self-pay | Admitting: Pulmonary Disease

## 2017-07-03 MED ORDER — FORMOTEROL FUMARATE 20 MCG/2ML IN NEBU: 20.0000 ug | INHALATION_SOLUTION | Freq: Two times a day (BID) | RESPIRATORY_TRACT | 6 refills | Status: DC

## 2017-07-03 NOTE — Telephone Encounter (Signed)
Spoke with pt's daughter, Marcelino Duster. Marcelino Duster is requesting that Perforomist be sent to [facility]. Rx has been sent to preferred pharmacy. Nothing further needed.

## 2017-07-04 ENCOUNTER — Telehealth: Payer: Self-pay | Admitting: Pulmonary Disease

## 2017-07-04 NOTE — Telephone Encounter (Signed)
I called Karen Hunter and verified what she needed me to fax over. She stated she just needed the office notes from the visit. I faxed over the information and nothing further is needed.

## 2017-07-09 ENCOUNTER — Telehealth: Payer: Self-pay | Admitting: Pulmonary Disease

## 2017-07-09 NOTE — Telephone Encounter (Signed)
Spoke with pt's daughter and advised PA was done today. I advised her we would get back to her when the insurance responds.

## 2017-07-10 NOTE — Telephone Encounter (Signed)
Checked PA on Cover My Meds. Key: [id number]. PA was "Archived" and was not submitted to [facility]. Called [employer] at [phone]. Pt's ID: [id number]. PA has been initiated for Perforomist. PA was sent for clinical review, we should receive a response in 24-48 hours.

## 2017-07-15 NOTE — Telephone Encounter (Signed)
Called and spoke to rep with [employer]. Performist was approved until 07/10/2019, PA#: [id number]. This was approved under part B. LMTCB for granddaughter, Elon Jester.

## 2017-07-16 NOTE — Telephone Encounter (Signed)
lmtcb X2 for pt's daughter Elon Jester to make aware of PA approval.

## 2017-07-16 NOTE — Telephone Encounter (Signed)
Spoke with Elon Jester. She is aware of the PA approval.

## 2017-07-18 ENCOUNTER — Encounter: Payer: Self-pay | Admitting: Internal Medicine

## 2017-07-18 ENCOUNTER — Ambulatory Visit (INDEPENDENT_AMBULATORY_CARE_PROVIDER_SITE_OTHER): Payer: Medicare HMO | Admitting: Internal Medicine

## 2017-07-18 VITALS — BP 118/56 | HR 77 | Ht 60.0 in | Wt 109.8 lb

## 2017-07-18 DIAGNOSIS — I2510 Atherosclerotic heart disease of native coronary artery without angina pectoris: Secondary | ICD-10-CM | POA: Diagnosis not present

## 2017-07-18 DIAGNOSIS — E782 Mixed hyperlipidemia: Secondary | ICD-10-CM

## 2017-07-18 DIAGNOSIS — R0602 Shortness of breath: Secondary | ICD-10-CM | POA: Diagnosis not present

## 2017-07-18 NOTE — Patient Instructions (Signed)
Your physician recommends that you continue on your current medications as directed. Please refer to the Current Medication list given to you today. Your physician wants you to follow-up in: 1 YEAR WITH DR. ROSS.  You will receive a reminder letter in the mail two months in advance. If you don't receive a letter, please call our office to schedule the follow-up appointment.  

## 2017-07-18 NOTE — Progress Notes (Signed)
Cardiology Office Note   Date:  07/18/2017   ID:  Karen Hunter, DOB [birth year], MRN [id number]  PCP:  Alysia Penna, MD  Cardiologist:   Dietrich Pates, MD    Pt presents for f/u of CAD    History of Present Illness: Karen Hunter is a 83 y.o. female with a history of CAD   She used to live in [place] state  She suffered and MI in early 1990s  Cath done at time   Per report by pt (no records) it sounds like vessel closed and filled via collaterals  Not clear that intervention done After that time the pt denies CP  She was followed in cardiology for about 1 year    The pt recently moved to [facility]  After [place] she lived some in [place]   MI in early 90s  Followed for about 1 year     (Addendum 11/20/16:  Two cath pictures from 1993 show RCA occlusion and then open post PTCA)  Since I saw her in Jan 2018 she has been seen by Dr Jamison Neighbor a few times in pulmonary   Also [facility] IM Breathing is not bad  On 3L O2  No CP  No edema     Current Meds  Medication Sig  . alendronate (FOSAMAX) 70 MG tablet Take 70 mg by mouth once a week.  Marland Kitchen aspirin 81 MG tablet Take 81 mg by mouth daily.  . budesonide (PULMICORT) 0.5 MG/2ML nebulizer solution Take 0.5 mg by nebulization as directed.   Marland Kitchen dextromethorphan (DELSYM) 30 MG/5ML liquid Take 60 mg by mouth daily as needed for cough.  . diclofenac sodium (VOLTAREN) 1 % GEL Apply 2 g topically 4 (four) times daily.  Marland Kitchen donepezil (ARICEPT) 5 MG tablet Take 5 mg by mouth at bedtime.  Marland Kitchen escitalopram (LEXAPRO) 10 MG tablet Take 10 mg by mouth daily.  . formoterol (PERFOROMIST) 20 MCG/2ML nebulizer solution Take 2 mLs (20 mcg total) by nebulization 2 (two) times daily.  Marland Kitchen guaiFENesin (MUCINEX) 600 MG 12 hr tablet Take 600 mg by mouth as needed.  Marland Kitchen guaiFENesin-dextromethorphan (ROBITUSSIN DM) 100-10 MG/5ML syrup Take 5 mLs by mouth every 4 (four) hours as needed for cough.  Marland Kitchen ipratropium-albuterol (DUONEB) 0.5-2.5 (3) MG/3ML SOLN 3 mLs as directed.   . metFORMIN  (GLUCOPHAGE) 500 MG tablet Take 500 mg by mouth 2 (two) times daily with a meal.   . simvastatin (ZOCOR) 10 MG tablet Take 10 mg by mouth daily.  . traZODone (DESYREL) 50 MG tablet Take 50 mg by mouth at bedtime.     Allergies:   Inh [isoniazid] and Naproxen   Past Medical History:  Diagnosis Date  . COPD (chronic obstructive pulmonary disease) (HCC)   . Coronary artery disease   . Diabetes mellitus without complication (HCC)   . Heart attack (HCC) 1993  . Hepatitis    Inh-induced  . Hyperlipidemia   . Hypertension   . Short-term memory loss     Past Surgical History:  Procedure Laterality Date  . ABDOMINAL HYSTERECTOMY    . LEFT HEART CATH  1993  . TONSILLECTOMY  age 69     Social History:  The patient  reports that she quit smoking about 17 months ago. Her smoking use included Cigarettes. She started smoking about 65 years ago. She has a 60.00 pack-year smoking history. She has never used smokeless tobacco. She reports that she drinks alcohol. She reports that she does not use drugs.  Family History:  The patient's family history includes Breast cancer in her maternal grandmother and mother; Emphysema in her father; Lung disease in her father.    ROS:  Please see the history of present illness. All other systems are reviewed and  Negative to the above problem except as noted.    PHYSICAL EXAM: VS:  BP (!) 118/56   Pulse 77   Ht 5' (1.524 m)   Wt 109 lb 12.8 oz (49.8 kg)   SpO2 97%   BMI 21.44 kg/m   GEN: Frail 83 yo in no acute distress   On oxygen HEENT: normal  Neck: JVP is normal  No , carotid bruits, or masses Cardiac: RRR; no murmurs, rubs, or gallops,no edema  Respiratory:  Moving air  Some rhonchi   GI: soft, nontender, nondistended, + BS  No hepatomegaly  MS: Moving lall extremities  Decreased ROM L shoulder    Skin: warm and dry, no rash Psych: euthymic mood, full affect   EKG:  EKG isnot ordered    Lipid Panel    Component Value Date/Time    CHOL  02/15/2009 0210    128        ATP III CLASSIFICATION:  <200     mg/dL   Desirable  161-096  mg/dL   Borderline High  >=045    mg/dL   High          TRIG 409 02/15/2009 0210   HDL 34 (L) 02/15/2009 0210   CHOLHDL 3.8 02/15/2009 0210   VLDL 30 02/15/2009 0210   LDLCALC  02/15/2009 0210    64        Total Cholesterol/HDL:CHD Risk Coronary Heart Disease Risk Table                     Men   Women  1/2 Average Risk   3.4   3.3  Average Risk       5.0   4.4  2 X Average Risk   9.6   7.1  3 X Average Risk  23.4   11.0        Use the calculated Patient Ratio above and the CHD Risk Table to determine the patient's CHD Risk.        ATP III CLASSIFICATION (LDL):  <100     mg/dL   Optimal  811-914  mg/dL   Near or Above                    Optimal  130-159  mg/dL   Borderline  782-956  mg/dL   High  >213     mg/dL   Very High      Wt Readings from Last 3 Encounters:  07/18/17 109 lb 12.8 oz (49.8 kg)  07/02/17 109 lb 6 oz (49.6 kg)  03/13/17 110 lb (49.9 kg)      ASSESSMENT AND PLAN:  1  CAD  No symptoms of active  angina    2.  Pulm  Pt has close f/u with Dr Jamison Neighbor   Relative says she coughs a lot at night in bed  Could try PPI and see if helps   3  COPD  Severe  Cotninue O2    3  HL  Will get labs  COntinue meds     Pt has a couple physicians  Will be available as neede d in interval  Otherwise schedule in 1 year.   Current medicines are reviewed at length  with the patient today.  The patient does not have concerns regarding medicines.  Signed, Dietrich Pates, MD  07/18/2017 4:25 PM    [facility] [address] [place], [place], [place]  [address] Phone: [phone]; Fax: [phone]

## 2017-07-22 DIAGNOSIS — R0609 Other forms of dyspnea: Secondary | ICD-10-CM | POA: Diagnosis not present

## 2017-07-22 DIAGNOSIS — F325 Major depressive disorder, single episode, in full remission: Secondary | ICD-10-CM | POA: Diagnosis not present

## 2017-07-22 DIAGNOSIS — Z23 Encounter for immunization: Secondary | ICD-10-CM | POA: Diagnosis not present

## 2017-07-22 DIAGNOSIS — I25118 Atherosclerotic heart disease of native coronary artery with other forms of angina pectoris: Secondary | ICD-10-CM | POA: Diagnosis not present

## 2017-07-22 DIAGNOSIS — E1165 Type 2 diabetes mellitus with hyperglycemia: Secondary | ICD-10-CM | POA: Diagnosis not present

## 2017-07-22 DIAGNOSIS — J449 Chronic obstructive pulmonary disease, unspecified: Secondary | ICD-10-CM | POA: Diagnosis not present

## 2017-07-22 DIAGNOSIS — D508 Other iron deficiency anemias: Secondary | ICD-10-CM | POA: Diagnosis not present

## 2017-07-22 DIAGNOSIS — M79602 Pain in left arm: Secondary | ICD-10-CM | POA: Diagnosis not present

## 2017-07-22 DIAGNOSIS — I10 Essential (primary) hypertension: Secondary | ICD-10-CM | POA: Diagnosis not present

## 2017-07-22 DIAGNOSIS — R05 Cough: Secondary | ICD-10-CM | POA: Diagnosis not present

## 2017-07-30 DIAGNOSIS — J449 Chronic obstructive pulmonary disease, unspecified: Secondary | ICD-10-CM | POA: Diagnosis not present

## 2017-08-08 ENCOUNTER — Telehealth: Payer: Self-pay | Admitting: Pulmonary Disease

## 2017-08-08 MED ORDER — ARFORMOTEROL TARTRATE 15 MCG/2ML IN NEBU: 15.0000 ug | INHALATION_SOLUTION | Freq: Two times a day (BID) | RESPIRATORY_TRACT | 1 refills | Status: DC

## 2017-08-08 NOTE — Telephone Encounter (Signed)
pts daughter stated that the pts insurance will not cover the perforomist and the pt does not want to pay over $500 for a medication to just try it.  JN please advise if there is something else that the pt may try in place of this?  Thanks

## 2017-08-08 NOTE — Telephone Encounter (Signed)
Spoke with daughter and advised her we would send in [place] to the pharmacy to see if this medication has better coverage. She will call to give a status on the coverage and call back if she has any problems. Nothing further is needed.

## 2017-08-08 NOTE — Telephone Encounter (Signed)
Can we check to see if they'll cover Brovana nebulized BID instead of Perforomist? Thanks.

## 2017-08-15 ENCOUNTER — Telehealth: Payer: Self-pay | Admitting: Pulmonary Disease

## 2017-08-15 NOTE — Telephone Encounter (Signed)
[facility] the [facility] needed a PA on her Brovana. Called [employer] to see if they could process the claim via Part B since she has medical benefits with them as well. PA department stated they still needed the PA to be processed.   PA was started on CoverMyMeds. Key is [id number].   Will keep an eye on CMM for determination.

## 2017-08-20 ENCOUNTER — Telehealth: Payer: Self-pay | Admitting: Pulmonary Disease

## 2017-08-20 MED ORDER — ARFORMOTEROL TARTRATE 15 MCG/2ML IN NEBU: 15.0000 ug | INHALATION_SOLUTION | Freq: Two times a day (BID) | RESPIRATORY_TRACT | 1 refills | Status: DC

## 2017-08-20 NOTE — Telephone Encounter (Signed)
Spoke with pt's daughter Elon JesterMichele, who states Karen Hunter is costly. Elon JesterMichele is aware that sometimes neb meds are cheaper through home care company. Elon JesterMichele has agreed to get quote through DME company. Elon JesterMichele states if Karen Hunter is not cheaper through home care company, could pt try an inhaler instead.  Rx for Karen Hunter has been printed and placed in JN's cubby for signature. [facility].  Will route to Corrine and JN to make aware.

## 2017-08-20 NOTE — Telephone Encounter (Signed)
I'll sign this once I'm back in office on Monday. We should see if we can switch over her Pulmicort as well as her Duonebs to her DME company. This may help with cost. Thanks.

## 2017-08-27 NOTE — Telephone Encounter (Signed)
This has been signed. [facility].

## 2017-08-30 DIAGNOSIS — J449 Chronic obstructive pulmonary disease, unspecified: Secondary | ICD-10-CM | POA: Diagnosis not present

## 2017-09-29 DIAGNOSIS — J449 Chronic obstructive pulmonary disease, unspecified: Secondary | ICD-10-CM | POA: Diagnosis not present

## 2017-10-10 DIAGNOSIS — R05 Cough: Secondary | ICD-10-CM | POA: Diagnosis not present

## 2017-10-10 DIAGNOSIS — J449 Chronic obstructive pulmonary disease, unspecified: Secondary | ICD-10-CM | POA: Diagnosis not present

## 2017-10-10 DIAGNOSIS — J189 Pneumonia, unspecified organism: Secondary | ICD-10-CM | POA: Diagnosis not present

## 2017-10-10 DIAGNOSIS — R634 Abnormal weight loss: Secondary | ICD-10-CM | POA: Diagnosis not present

## 2017-10-10 DIAGNOSIS — E1165 Type 2 diabetes mellitus with hyperglycemia: Secondary | ICD-10-CM | POA: Diagnosis not present

## 2017-10-10 DIAGNOSIS — Z6821 Body mass index (BMI) 21.0-21.9, adult: Secondary | ICD-10-CM | POA: Diagnosis not present

## 2017-10-10 DIAGNOSIS — R531 Weakness: Secondary | ICD-10-CM | POA: Diagnosis not present

## 2017-10-23 ENCOUNTER — Emergency Department (HOSPITAL_BASED_OUTPATIENT_CLINIC_OR_DEPARTMENT_OTHER): Payer: Medicare HMO

## 2017-10-23 ENCOUNTER — Encounter (HOSPITAL_BASED_OUTPATIENT_CLINIC_OR_DEPARTMENT_OTHER): Payer: Self-pay | Admitting: *Deleted

## 2017-10-23 ENCOUNTER — Inpatient Hospital Stay (HOSPITAL_BASED_OUTPATIENT_CLINIC_OR_DEPARTMENT_OTHER)
Admission: EM | Admit: 2017-10-23 | Discharge: 2017-10-25 | DRG: 812 | Disposition: A | Discharge status: Home / Self Care | Payer: Medicare HMO | Attending: Internal Medicine | Admitting: Internal Medicine

## 2017-10-23 ENCOUNTER — Other Ambulatory Visit: Payer: Self-pay

## 2017-10-23 DIAGNOSIS — E785 Hyperlipidemia, unspecified: Secondary | ICD-10-CM | POA: Diagnosis present

## 2017-10-23 DIAGNOSIS — I252 Old myocardial infarction: Secondary | ICD-10-CM | POA: Diagnosis not present

## 2017-10-23 DIAGNOSIS — E119 Type 2 diabetes mellitus without complications: Secondary | ICD-10-CM

## 2017-10-23 DIAGNOSIS — J440 Chronic obstructive pulmonary disease with acute lower respiratory infection: Secondary | ICD-10-CM | POA: Diagnosis not present

## 2017-10-23 DIAGNOSIS — J9611 Chronic respiratory failure with hypoxia: Secondary | ICD-10-CM | POA: Diagnosis present

## 2017-10-23 DIAGNOSIS — Z9981 Dependence on supplemental oxygen: Secondary | ICD-10-CM

## 2017-10-23 DIAGNOSIS — R531 Weakness: Secondary | ICD-10-CM

## 2017-10-23 DIAGNOSIS — D649 Anemia, unspecified: Secondary | ICD-10-CM | POA: Diagnosis present

## 2017-10-23 DIAGNOSIS — I10 Essential (primary) hypertension: Secondary | ICD-10-CM | POA: Diagnosis present

## 2017-10-23 DIAGNOSIS — J449 Chronic obstructive pulmonary disease, unspecified: Secondary | ICD-10-CM | POA: Diagnosis present

## 2017-10-23 DIAGNOSIS — F419 Anxiety disorder, unspecified: Secondary | ICD-10-CM | POA: Diagnosis present

## 2017-10-23 DIAGNOSIS — Z87891 Personal history of nicotine dependence: Secondary | ICD-10-CM | POA: Diagnosis not present

## 2017-10-23 DIAGNOSIS — N390 Urinary tract infection, site not specified: Secondary | ICD-10-CM | POA: Diagnosis present

## 2017-10-23 DIAGNOSIS — Z79899 Other long term (current) drug therapy: Secondary | ICD-10-CM

## 2017-10-23 DIAGNOSIS — R059 Cough, unspecified: Secondary | ICD-10-CM

## 2017-10-23 DIAGNOSIS — R05 Cough: Secondary | ICD-10-CM

## 2017-10-23 DIAGNOSIS — D509 Iron deficiency anemia, unspecified: Secondary | ICD-10-CM | POA: Diagnosis present

## 2017-10-23 DIAGNOSIS — I2510 Atherosclerotic heart disease of native coronary artery without angina pectoris: Secondary | ICD-10-CM | POA: Diagnosis present

## 2017-10-23 DIAGNOSIS — Z7982 Long term (current) use of aspirin: Secondary | ICD-10-CM | POA: Diagnosis not present

## 2017-10-23 HISTORY — DX: Anxiety disorder, unspecified: F41.9

## 2017-10-23 LAB — INFLUENZA PANEL BY PCR (TYPE A & B)
Influenza A By PCR: NEGATIVE
Influenza B By PCR: NEGATIVE

## 2017-10-23 LAB — COMPREHENSIVE METABOLIC PANEL
ALT: 10 U/L — ABNORMAL LOW (ref 14–54)
AST: 22 U/L (ref 15–41)
Albumin: 3.1 g/dL — ABNORMAL LOW (ref 3.5–5.0)
Alkaline Phosphatase: 45 U/L (ref 38–126)
Anion gap: 9 (ref 5–15)
BUN: 10 mg/dL (ref 6–20)
CO2: 27 mmol/L (ref 22–32)
Calcium: 8.8 mg/dL — ABNORMAL LOW (ref 8.9–10.3)
Chloride: 101 mmol/L (ref 101–111)
Creatinine, Ser: 0.82 mg/dL (ref 0.44–1.00)
GFR calc Af Amer: >60 mL/min (ref >60)
GFR calc non Af Amer: >60 mL/min (ref >60)
Glucose, Bld: 251 mg/dL — ABNORMAL HIGH (ref 65–99)
Potassium: 4 mmol/L (ref 3.5–5.1)
Sodium: 137 mmol/L (ref 135–145)
Total Bilirubin: 0.2 mg/dL — ABNORMAL LOW (ref 0.3–1.2)
Total Protein: 6 g/dL — ABNORMAL LOW (ref 6.5–8.1)

## 2017-10-23 LAB — VITAMIN B12: Vitamin B-12: 755 pg/mL (ref 180–914)

## 2017-10-23 LAB — CBC
HCT: 21.3 % — ABNORMAL LOW (ref 36.0–46.0)
Hemoglobin: 5.6 g/dL — CL (ref 12.0–15.0)
MCH: 19.9 pg — ABNORMAL LOW (ref 26.0–34.0)
MCHC: 26.3 g/dL — ABNORMAL LOW (ref 30.0–36.0)
MCV: 75.5 fL — ABNORMAL LOW (ref 78.0–100.0)
Platelets: 322 10*3/uL (ref 150–400)
RBC: 2.82 MIL/uL — ABNORMAL LOW (ref 3.87–5.11)
RDW: 19 % — ABNORMAL HIGH (ref 11.5–15.5)
WBC: 7.4 10*3/uL (ref 4.0–10.5)

## 2017-10-23 LAB — IRON AND TIBC
Iron: 8 ug/dL — ABNORMAL LOW (ref 28–170)
Saturation Ratios: 3 % — ABNORMAL LOW (ref 10.4–31.8)
TIBC: 311 ug/dL (ref 250–450)
UIBC: 303 ug/dL

## 2017-10-23 LAB — RETICULOCYTES
RBC.: 2.88 MIL/uL — ABNORMAL LOW (ref 3.87–5.11)
Retic Count, Absolute: 118.1 10*3/uL (ref 19.0–186.0)
Retic Ct Pct: 4.1 % — ABNORMAL HIGH (ref 0.4–3.1)

## 2017-10-23 LAB — OCCULT BLOOD X 1 CARD TO LAB, STOOL: Fecal Occult Bld: NEGATIVE

## 2017-10-23 LAB — PROTIME-INR
INR: 1.03
Prothrombin Time: 13.4 seconds (ref 11.4–15.2)

## 2017-10-23 LAB — TROPONIN I: Troponin I: <0.03 ng/mL (ref <0.03)

## 2017-10-23 LAB — APTT: aPTT: 28 seconds (ref 24–36)

## 2017-10-23 LAB — FERRITIN: Ferritin: 7 ng/mL — ABNORMAL LOW (ref 11–307)

## 2017-10-23 LAB — LIPASE, BLOOD: Lipase: 22 U/L (ref 11–51)

## 2017-10-23 LAB — PREPARE RBC (CROSSMATCH)

## 2017-10-23 LAB — FOLATE: FOLATE: 21.1 ng/mL (ref >5.9)

## 2017-10-23 MED ORDER — VITAMIN E 180 MG (400 UNIT) PO CAPS
400.0000 [IU] | ORAL_CAPSULE | Freq: Every day | ORAL | Status: DC
  Administered 2017-10-24 – 2017-10-25 (×2): 400 [IU] via ORAL
  Filled 2017-10-23 (×2): qty 1

## 2017-10-23 MED ORDER — ENSURE ENLIVE PO LIQD
237.0000 mL | Freq: Two times a day (BID) | ORAL | Status: DC
  Administered 2017-10-24 – 2017-10-25 (×2): 237 mL via ORAL

## 2017-10-23 MED ORDER — SIMVASTATIN 10 MG PO TABS
10.0000 mg | ORAL_TABLET | Freq: Every day | ORAL | Status: DC
  Administered 2017-10-24 – 2017-10-25 (×2): 10 mg via ORAL
  Filled 2017-10-23 (×2): qty 1

## 2017-10-23 MED ORDER — METFORMIN HCL 500 MG PO TABS
500.0000 mg | ORAL_TABLET | Freq: Two times a day (BID) | ORAL | Status: DC
  Administered 2017-10-24 – 2017-10-25 (×2): 500 mg via ORAL
  Filled 2017-10-23 (×2): qty 1

## 2017-10-23 MED ORDER — SODIUM CHLORIDE 0.9 % IV SOLN
Freq: Once | INTRAVENOUS | Status: AC
  Administered 2017-10-23: 1000 mL via INTRAVENOUS

## 2017-10-23 MED ORDER — SODIUM CHLORIDE 0.9 % IV SOLN
Freq: Once | INTRAVENOUS | Status: AC
  Administered 2017-10-23: via INTRAVENOUS

## 2017-10-23 MED ORDER — ADULT MULTIVITAMIN LIQUID CH
15.0000 mL | Freq: Every day | ORAL | Status: DC
  Administered 2017-10-24 – 2017-10-25 (×2): 15 mL via ORAL
  Filled 2017-10-23 (×2): qty 15

## 2017-10-23 MED ORDER — FERROUS SULFATE 300 (60 FE) MG/5ML PO SYRP
300.0000 mg | ORAL_SOLUTION | Freq: Every day | ORAL | Status: DC
  Administered 2017-10-24 – 2017-10-25 (×2): 300 mg via ORAL
  Filled 2017-10-23 (×2): qty 5

## 2017-10-23 MED ORDER — BUDESONIDE 0.5 MG/2ML IN SUSP
0.5000 mg | Freq: Two times a day (BID) | RESPIRATORY_TRACT | Status: DC
Start: 2017-10-23 — End: 2017-10-25
  Administered 2017-10-24 – 2017-10-25 (×3): 0.5 mg via RESPIRATORY_TRACT
  Filled 2017-10-23 (×4): qty 2

## 2017-10-23 MED ORDER — ASPIRIN EC 81 MG PO TBEC
81.0000 mg | DELAYED_RELEASE_TABLET | Freq: Every day | ORAL | Status: DC
  Administered 2017-10-24 – 2017-10-25 (×2): 81 mg via ORAL
  Filled 2017-10-23 (×2): qty 1

## 2017-10-23 MED ORDER — ACETAMINOPHEN 650 MG RE SUPP: 650.0000 mg | Freq: Four times a day (QID) | RECTAL | Status: DC | PRN

## 2017-10-23 MED ORDER — ONDANSETRON HCL 4 MG PO TABS: 4.0000 mg | ORAL_TABLET | Freq: Four times a day (QID) | ORAL | Status: DC | PRN

## 2017-10-23 MED ORDER — SODIUM CHLORIDE 0.9 % IV BOLUS (SEPSIS)
500.0000 mL | Freq: Once | INTRAVENOUS | Status: AC
  Administered 2017-10-24: 500 mL via INTRAVENOUS

## 2017-10-23 MED ORDER — ONDANSETRON HCL 4 MG/2ML IJ SOLN: 4.0000 mg | Freq: Four times a day (QID) | INTRAMUSCULAR | Status: DC | PRN

## 2017-10-23 MED ORDER — ACETAMINOPHEN 325 MG PO TABS
650.0000 mg | ORAL_TABLET | Freq: Four times a day (QID) | ORAL | Status: DC | PRN
  Administered 2017-10-24: 650 mg via ORAL
  Filled 2017-10-23: qty 2

## 2017-10-23 MED ORDER — GUAIFENESIN ER 600 MG PO TB12: 600.0000 mg | ORAL_TABLET | ORAL | Status: DC | PRN

## 2017-10-23 MED ORDER — SODIUM CHLORIDE 0.9 % IV BOLUS (SEPSIS)
1000.0000 mL | Freq: Once | INTRAVENOUS | Status: AC
  Administered 2017-10-23: 1000 mL via INTRAVENOUS

## 2017-10-23 MED ORDER — ESCITALOPRAM OXALATE 10 MG PO TABS
10.0000 mg | ORAL_TABLET | Freq: Every day | ORAL | Status: DC
  Administered 2017-10-24 – 2017-10-25 (×2): 10 mg via ORAL
  Filled 2017-10-23 (×2): qty 1

## 2017-10-23 MED ORDER — PANTOPRAZOLE SODIUM 40 MG IV SOLR
40.0000 mg | Freq: Once | INTRAVENOUS | Status: AC
  Administered 2017-10-23: 40 mg via INTRAVENOUS
  Filled 2017-10-23: qty 40

## 2017-10-23 MED ORDER — TRAZODONE HCL 50 MG PO TABS
50.0000 mg | ORAL_TABLET | Freq: Every day | ORAL | Status: DC
  Administered 2017-10-24 (×2): 50 mg via ORAL
  Filled 2017-10-23 (×2): qty 1

## 2017-10-23 MED ORDER — DONEPEZIL HCL 5 MG PO TABS
5.0000 mg | ORAL_TABLET | Freq: Every day | ORAL | Status: DC
  Administered 2017-10-24 – 2017-10-25 (×2): 5 mg via ORAL
  Filled 2017-10-23 (×2): qty 1

## 2017-10-23 MED ORDER — IPRATROPIUM-ALBUTEROL 0.5-2.5 (3) MG/3ML IN SOLN
3.0000 mL | Freq: Three times a day (TID) | RESPIRATORY_TRACT | Status: DC
  Administered 2017-10-24: 3 mL via RESPIRATORY_TRACT
  Filled 2017-10-23 (×2): qty 3

## 2017-10-23 MED ORDER — FAMOTIDINE 20 MG PO TABS
20.0000 mg | ORAL_TABLET | Freq: Every day | ORAL | Status: DC
  Administered 2017-10-24 (×2): 20 mg via ORAL
  Filled 2017-10-23 (×2): qty 1

## 2017-10-23 MED ORDER — DICLOFENAC SODIUM 1 % TD GEL: 2.0000 g | Freq: Four times a day (QID) | TRANSDERMAL | Status: DC

## 2017-10-23 NOTE — ED Provider Notes (Signed)
[facility] EMERGENCY DEPARTMENT Provider Note   CSN: [id number] Arrival date & time: 10/23/17  1537     History   Chief Complaint Chief Complaint  Patient presents with  . Influenza    HPI Karen Hunter is a 84 y.o. female.  HPI   Patient is an 84 year old female with a history of CAD, status post angioplasty, MI, type 2 diabetes mellitus (on Metformin), COPD, hyperlipidemia, and hypertension presenting for weakness, increasing shortness of breath, and productive cough.  Patient is on 3 L of O2 at baseline.  No new oxygen requirements.  History is obtained by patient and her daughter.  Patient reports that she was diagnosed with community-acquired pneumonia on December 29 of 2018 by her primary care provider.  Patient was placed on Levaquin and finished this course.  Subsequently, patient improved, however over the past week her daughter noted that she began to be progressively weaker as well as beginning to have a productive cough again.  No hemoptysis.  Patient becomes short of breath when she does any kind of activity and "hyperventilates", and gets chest pain with this activity.  Patient does not have chest pain at rest.  Patient has remained afebrile since treatment for community-acquired pneumonia.  Patient denies chills, nausea, vomiting.  Patient had multiple episodes of diarrhea yesterday, however these have resolved today.  No bright red blood per rectum.  Patient does note that her stools are black, however she does take oral iron.  Patient and her daughter deny any history of bleeding per GI tract, or low hemoglobin requiring blood transfusion in the past.  Past Medical History:  Diagnosis Date  . Anxiety   . COPD (chronic obstructive pulmonary disease) (HCC)   . Coronary artery disease   . Diabetes mellitus without complication (HCC)   . Heart attack (HCC) 1993  . Hepatitis    Inh-induced  . Hyperlipidemia   . Hypertension   . Short-term memory loss      Patient Active Problem List   Diagnosis Date Noted  . COPD (chronic obstructive pulmonary disease) (HCC) 10/26/2016  . Pulmonary emphysema (HCC) 10/26/2016  . Chronic respiratory failure (HCC) 10/26/2016  . Short-term memory loss 10/26/2016  . DM2 (diabetes mellitus, type 2) (HCC) 07/18/2015  . Coronary artery disease 10/01/2013  . Essential hypertension 10/01/2013  . Hyperlipidemia 10/01/2013    Past Surgical History:  Procedure Laterality Date  . ABDOMINAL HYSTERECTOMY    . LEFT HEART CATH  1993  . TONSILLECTOMY  age 8    OB History    No data available       Home Medications    Prior to Admission medications   Medication Sig Start Date End Date Taking? Authorizing Provider  alendronate (FOSAMAX) 70 MG tablet Take 70 mg by mouth once a week. 07/14/15   [provider]  arformoterol (BROVANA) 15 MCG/2ML NEBU Take 2 mLs (15 mcg total) 2 (two) times daily by nebulization. 08/20/17   Roslynn Amble, MD  aspirin 81 MG tablet Take 81 mg by mouth daily.    [provider]  budesonide (PULMICORT) 0.5 MG/2ML nebulizer solution Take 0.5 mg by nebulization as directed.  09/18/16   [provider]  dextromethorphan (DELSYM) 30 MG/5ML liquid Take 60 mg by mouth daily as needed for cough.    [provider]  diclofenac sodium (VOLTAREN) 1 % GEL Apply 2 g topically 4 (four) times daily. 04/18/17   Tarry Kos, MD  donepezil (ARICEPT) 5 MG  tablet Take 5 mg by mouth at bedtime. 07/13/15   [provider]  escitalopram (LEXAPRO) 10 MG tablet Take 10 mg by mouth daily. 07/15/15   [provider]  formoterol (PERFOROMIST) 20 MCG/2ML nebulizer solution Take 2 mLs (20 mcg total) by nebulization 2 (two) times daily. 07/03/17   Roslynn Amble, MD  guaiFENesin (MUCINEX) 600 MG 12 hr tablet Take 600 mg by mouth as needed.    [provider]  guaiFENesin-dextromethorphan (ROBITUSSIN DM) 100-10 MG/5ML syrup Take 5 mLs by mouth every  4 (four) hours as needed for cough. 07/21/15   Mikhail, Nita Sells, DO  ipratropium-albuterol (DUONEB) 0.5-2.5 (3) MG/3ML SOLN 3 mLs as directed.  09/18/16   [provider]  metFORMIN (GLUCOPHAGE) 500 MG tablet Take 500 mg by mouth 2 (two) times daily with a meal.  09/18/16   [provider]  simvastatin (ZOCOR) 10 MG tablet Take 10 mg by mouth daily. 08/14/13   [provider]  traZODone (DESYREL) 50 MG tablet Take 50 mg by mouth at bedtime. 06/23/15   [provider]    Family History Family History  Problem Relation Age of Onset  . Breast cancer Mother   . Lung disease Father   . Emphysema Father   . Breast cancer Maternal Grandmother     Social History Social History   Tobacco Use  . Smoking status: Former Smoker    Packs/day: 1.00    Years: 60.00    Pack years: 60.00    Types: Cigarettes    Start date: 10/16/1949    Last attempt to quit: 02/13/2016    Years since quitting: 1.6  . Smokeless tobacco: Never Used  . Tobacco comment: She quit for only months at time  Substance Use Topics  . Alcohol use: Yes    Comment: occ mixed drinks  . Drug use: No     Allergies   Inh [isoniazid] and Naproxen   Review of Systems Review of Systems  Constitutional: Negative for chills and fever.  HENT: Positive for rhinorrhea. Negative for congestion, sore throat and trouble swallowing.   Eyes: Negative for visual disturbance.  Respiratory: Positive for cough, shortness of breath and wheezing.   Cardiovascular: Positive for chest pain.  Gastrointestinal: Positive for diarrhea. Negative for abdominal pain, anal bleeding, nausea and vomiting.  Genitourinary: Negative for dysuria and vaginal bleeding.  Musculoskeletal: Negative for arthralgias and myalgias.  Skin: Negative for color change.  Neurological: Positive for light-headedness. Negative for syncope and headaches.     Physical Exam Updated Vital Signs BP (!) 108/45   Pulse 83   Temp 98.3 F  (36.8 C) (Oral)   Resp 19   Ht 5' (1.524 m)   Wt 49.4 kg (109 lb)   SpO2 100%   BMI 21.29 kg/m   Physical Exam  Constitutional: She appears well-developed and well-nourished. No distress.  HENT:  Head: Normocephalic and atraumatic.  Mouth/Throat: Oropharynx is clear and moist.  Eyes: Conjunctivae and EOM are normal. Pupils are equal, round, and reactive to light.  Conjunctiva are pale.  Neck: Normal range of motion. Neck supple.  Cardiovascular: Normal rate, regular rhythm, S1 normal and S2 normal.  No murmur heard. Pulmonary/Chest: Effort normal. She has wheezes. She has no rales.  Soft wheezes in lung bases.  Abdominal: Soft. She exhibits no distension. There is no tenderness. There is no guarding.  Genitourinary: Rectal exam shows guaiac negative stool.  Genitourinary Comments: Exam performed with nurse tech chaperone present.  There are  no external lesions of the anus.  No external hemorrhoids.  Normal rectal tone.  There are no masses within the rectal vault.  No internal hemorrhoids.  There is dark, soft stool within the rectal vault.  Musculoskeletal: Normal range of motion. She exhibits no edema or deformity.  Lymphadenopathy:    She has no cervical adenopathy.  Neurological: She is alert.  Cranial nerves grossly intact. Patient moves extremities symmetrically and with good coordination.  Skin: Skin is warm and dry. No rash noted. No erythema.  Psychiatric: She has a normal mood and affect. Her behavior is normal. Judgment and thought content normal.  Nursing note and vitals reviewed.    ED Treatments / Results  Labs (all labs ordered are listed, but only abnormal results are displayed) Labs Reviewed  COMPREHENSIVE METABOLIC PANEL - Abnormal; Notable for the following components:      Result Value   Glucose, Bld 251 (*)    Calcium 8.8 (*)    Total Protein 6.0 (*)    Albumin 3.1 (*)    ALT 10 (*)    Total Bilirubin 0.2 (*)    All other components within normal  limits  CBC - Abnormal; Notable for the following components:   RBC 2.82 (*)    Hemoglobin 5.6 (*)    HCT 21.3 (*)    MCV 75.5 (*)    MCH 19.9 (*)    MCHC 26.3 (*)    RDW 19.0 (*)    All other components within normal limits  LIPASE, BLOOD  OCCULT BLOOD X 1 CARD TO LAB, STOOL  PROTIME-INR  APTT  TROPONIN I  URINALYSIS, ROUTINE W REFLEX MICROSCOPIC  VITAMIN B12  FOLATE  IRON AND TIBC  FERRITIN  RETICULOCYTES  INFLUENZA PANEL BY PCR (TYPE A & B)    EKG  EKG Interpretation None       Radiology Dg Chest 2 View  Result Date: 10/23/2017 CLINICAL DATA:  Cough, nausea, vomiting, diarrhea and weakness for 3 days. Recent diagnosis of pneumonia. Former smoker. EXAM: CHEST  2 VIEW COMPARISON:  Chest radiograph October 26, 2016 FINDINGS: Cardiomediastinal silhouette is normal. Calcified aortic knob. Increased lung volumes with chronic interstitial changes and strandy densities in lung bases. No pleural effusion or focal consolidation. No pneumothorax. Mild degenerative change of the thoracic spine. Advanced RIGHT acromioclavicular osteoarthrosis. IMPRESSION: COPD with bibasilar scarring/fibrosis. Aortic Atherosclerosis (ICD10-I70.0) and Emphysema (ICD10-J43.9). Electronically Signed   By: Awilda Metro M.D.   On: 10/23/2017 16:51    Procedures Procedures (including critical care time)  Medications Ordered in ED Medications - No data to display   Initial Impression / Assessment and Plan / ED Course  I have reviewed the triage vital signs and the nursing notes.  Pertinent labs & imaging results that were available during my care of the patient were reviewed by me and considered in my medical decision making (see chart for details).     Final Clinical Impressions(s) / ED Diagnoses   Final diagnoses:  Microcytic anemia  Generalized weakness  Cough   Patient is nontoxic-appearing, afebrile, in no acute distress at rest.  Patient is non-tachycardic.  With small movements,  patient does become tachycardic.  Due to patient's hemoglobin of 5.5, as well as lack of transfusion resources here at [facility], will initiate transfer.  Anemia panel is ordered for send out.  This is a microcytic anemia with an MCV of 75.5. Hct 21.3.  EKG demonstrates old MI with anteroseptal infarct.  Other laboratory  analysis is significant for glucose of 252.  Troponin is negative.  Awaiting influenza results.  Chest x-ray significant for chronic interstitial scarring, however no clear infiltrate at this time.  Case was discussed with Dr. Pricilla LovelessScott Goldston, who will evaluate the patient.  Will call [facility] for admission.   6:41 PM Spoke with Dr. Melynda RippleHobbs of [facility].  Patient to be admitted [facility] telemetry [facility] at [facility]. Patient awaiting [facility] transfer.   This is a shared visit with Dr. Pricilla LovelessScott Goldston. Patient was independently evaluated by this attending physician. Attending physician consulted in evaluation and admission management.   ED Discharge Orders    None       Delia ChimesMurray, Jaycie Kregel B, PA-C 10/24/17 [id number]    Pricilla LovelessGoldston, Scott, MD 10/25/17 [phone]

## 2017-10-23 NOTE — ED Triage Notes (Signed)
Daughter states flu like symptoms including n/v/d generalized weakness and cough . Dec 27 DX pneumonia

## 2017-10-23 NOTE — Plan of Care (Signed)
[facility]  84yo F  W/ pmhx of htn hld hepatitis MI MI CAD COPD presents with NVD & weakness.  Reason for admit: symptomatic anemia  Of note pt was treated for cap with Levaquin last month. Last colonoscopy unk. Guaiac neg in ED.  Requested: IV protonix 40mg  once  Status: obs  tele  Haydee SalterPhillip M Hobbs MD

## 2017-10-23 NOTE — H&P (Signed)
History and Physical    Karen Hunter XWR:[id number] DOB: [birth year] DOA: 10/23/2017  PCP: Alysia Penna, MD  Patient coming from: Home  I have personally briefly reviewed patient's old medical records in [facility]  Chief Complaint: Fatigue  HPI: Karen Hunter is a 84 y.o. female with medical history significant of DM2, CAD on ASA, HTN, looks like iron def anemia (someone put her on PO iron anyhow).  Patient presents to ED at [facility] with c/o fatigue for > 1 month.  Worsening, onset was insidious.  Decreased PO intake through at least Dec.  Does have chronically dark stools but attributes this to PO iron she takes.   Denies N/V/D to me, denies cough, denies fever.  She isnt quite sure where the history of these are coming from in her chart she says.   ED Course: HGB 5.6 down from 13.2 in 2016.  Iron level of 8.  Hemoccult neg.   Review of Systems: As per HPI otherwise 10 point review of systems negative.   Past Medical History:  Diagnosis Date  . Anxiety   . COPD (chronic obstructive pulmonary disease) (HCC)   . Coronary artery disease   . Diabetes mellitus without complication (HCC)   . Heart attack (HCC) 1993  . Hepatitis    Inh-induced  . Hyperlipidemia   . Hypertension   . Short-term memory loss     Past Surgical History:  Procedure Laterality Date  . ABDOMINAL HYSTERECTOMY    . LEFT HEART CATH  1993  . TONSILLECTOMY  age 68     reports that she quit smoking about 20 months ago. Her smoking use included cigarettes. She started smoking about 66 years ago. She has a 60.00 pack-year smoking history. she has never used smokeless tobacco. She reports that she drinks alcohol. She reports that she does not use drugs.  Allergies  Allergen Reactions  . Inh [Isoniazid] Other (See Comments)    hepatitis  . Naproxen Hives    Family History  Problem Relation Age of Onset  . Breast cancer Mother   . Lung disease Father   . Emphysema Father   . Breast cancer  Maternal Grandmother      Prior to Admission medications   Medication Sig Start Date End Date Taking? Authorizing Provider  alendronate (FOSAMAX) 70 MG tablet Take 70 mg by mouth once a week. On Sunday 07/14/15  Yes [provider]  aspirin 81 MG tablet Take 81 mg by mouth daily.   Yes [provider]  budesonide (PULMICORT) 0.5 MG/2ML nebulizer solution Take 0.5 mg by nebulization 2 (two) times daily.  09/18/16  Yes [provider]  diclofenac sodium (VOLTAREN) 1 % GEL Apply 2 g topically 4 (four) times daily. 04/18/17  Yes Tarry Kos, MD  donepezil (ARICEPT) 5 MG tablet Take 5 mg by mouth daily.  07/13/15  Yes [provider]  escitalopram (LEXAPRO) 10 MG tablet Take 10 mg by mouth daily. 07/15/15  Yes [provider]  famotidine (PEPCID) 20 MG tablet Take 20 mg by mouth at bedtime. 07/31/17  Yes [provider]  Ferrous Sulfate 220 (44 Fe) MG/5ML LIQD Take 5 mLs by mouth daily. 07/31/17  Yes [provider]  guaiFENesin (MUCINEX) 600 MG 12 hr tablet Take 600 mg by mouth as needed.   Yes [provider]  ipratropium-albuterol (DUONEB) 0.5-2.5 (3) MG/3ML SOLN Inhale 3 mLs into the lungs 3 (three) times daily.  09/18/16  Yes [provider]  metFORMIN (GLUCOPHAGE) 500 MG tablet Take 500 mg by mouth 2 (two) times daily with a meal.  09/18/16  Yes [provider]  Multiple Vitamins-Minerals (MULTIVITAMIN WITH IRON-MINERALS) liquid Take 5 mLs by mouth daily.   Yes [provider]  simvastatin (ZOCOR) 10 MG tablet Take 10 mg by mouth daily. 08/14/13  Yes [provider]  traZODone (DESYREL) 50 MG tablet Take 50 mg by mouth at bedtime. 06/23/15  Yes [provider]  vitamin E 400 UNIT capsule Take 400 Units by mouth daily.   Yes [provider]    Physical Exam: Vitals:   10/23/17 1800 10/23/17 1930 10/23/17 1930 10/23/17 2102  BP: (!) 112/46 (!) 90/43  (!) 117/52  Pulse: 80 83   90  Resp: 18 18  18   Temp:   98.2 F (36.8 C) 98.9 F (37.2 C)  TempSrc:   Oral Oral  SpO2: 100% 100%  98%  Weight:    46.5 kg (102 lb 8 oz)  Height:    5' (1.524 m)    Constitutional: NAD, calm, comfortable Eyes: PERRL, lids and conjunctivae normal ENMT: Mucous membranes are moist. Posterior pharynx clear of any exudate or lesions.Normal dentition.  Neck: normal, supple, no masses, no thyromegaly Respiratory: clear to auscultation bilaterally, no wheezing, no crackles. Normal respiratory effort. No accessory muscle use.  Cardiovascular: Regular rate and rhythm, no murmurs / rubs / gallops. No extremity edema. 2+ pedal pulses. No carotid bruits.  Abdomen: no tenderness, no masses palpated. No hepatosplenomegaly. Bowel sounds positive.  Musculoskeletal: no clubbing / cyanosis. No joint deformity upper and lower extremities. Good ROM, no contractures. Normal muscle tone.  Skin: no rashes, lesions, ulcers. No induration Neurologic: CN 2-12 grossly intact. Sensation intact, DTR normal. Strength 5/5 in all 4.  Psychiatric: Normal judgment and insight. Alert and oriented x 3. Normal mood.    Labs on Admission: I have personally reviewed following labs and imaging studies  CBC: Recent Labs  Lab 10/23/17 1606  WBC 7.4  HGB 5.6*  HCT 21.3*  MCV 75.5*  PLT 322   Basic Metabolic Panel: Recent Labs  Lab 10/23/17 1606  NA 137  K 4.0  CL 101  CO2 27  GLUCOSE 251*  BUN 10  CREATININE 0.82  CALCIUM 8.8*   GFR: Estimated Creatinine Clearance: 36.7 mL/min (by C-G formula based on SCr of 0.82 mg/dL). Liver Function Tests: Recent Labs  Lab 10/23/17 1606  AST 22  ALT 10*  ALKPHOS 45  BILITOT 0.2*  PROT 6.0*  ALBUMIN 3.1*   Recent Labs  Lab 10/23/17 1606  LIPASE 22   No results for input(s): AMMONIA in the last 168 hours. Coagulation Profile: Recent Labs  Lab 10/23/17 1606  INR 1.03   Cardiac Enzymes: Recent Labs  Lab 10/23/17 1606  TROPONINI <0.03   BNP  (last 3 results) No results for input(s): PROBNP in the last 8760 hours. HbA1C: No results for input(s): HGBA1C in the last 72 hours. CBG: No results for input(s): GLUCAP in the last 168 hours. Lipid Profile: No results for input(s): CHOL, HDL, LDLCALC, TRIG, CHOLHDL, LDLDIRECT in the last 72 hours. Thyroid Function Tests: No results for input(s): TSH, T4TOTAL, FREET4, T3FREE, THYROIDAB in the last 72 hours. Anemia Panel: Recent Labs    10/23/17 1606 10/23/17 1807  VITAMINB12  --  755  FOLATE  --  21.1  FERRITIN  --  7*  TIBC  --  311  IRON  --  8*  RETICCTPCT 4.1*  --  Urine analysis:    Component Value Date/Time   COLORURINE YELLOW 08/09/2015 1700   APPEARANCEUR CLOUDY (A) 08/09/2015 1700   LABSPEC 1.015 08/09/2015 1700   PHURINE 7.0 08/09/2015 1700   GLUCOSEU >1000 (A) 08/09/2015 1700   HGBUR NEGATIVE 08/09/2015 1700   BILIRUBINUR NEGATIVE 08/09/2015 1700   KETONESUR NEGATIVE 08/09/2015 1700   PROTEINUR NEGATIVE 08/09/2015 1700   UROBILINOGEN 0.2 08/09/2015 1700   NITRITE NEGATIVE 08/09/2015 1700   LEUKOCYTESUR NEGATIVE 08/09/2015 1700    Radiological Exams on Admission: Dg Chest 2 View  Result Date: 10/23/2017 CLINICAL DATA:  Cough, nausea, vomiting, diarrhea and weakness for 3 days. Recent diagnosis of pneumonia. Former smoker. EXAM: CHEST  2 VIEW COMPARISON:  Chest radiograph October 26, 2016 FINDINGS: Cardiomediastinal silhouette is normal. Calcified aortic knob. Increased lung volumes with chronic interstitial changes and strandy densities in lung bases. No pleural effusion or focal consolidation. No pneumothorax. Mild degenerative change of the thoracic spine. Advanced RIGHT acromioclavicular osteoarthrosis. IMPRESSION: COPD with bibasilar scarring/fibrosis. Aortic Atherosclerosis (ICD10-I70.0) and Emphysema (ICD10-J43.9). Electronically Signed   By: Awilda Metro M.D.   On: 10/23/2017 16:51    EKG: Independently reviewed.  Assessment/Plan Principal  Problem:   Symptomatic anemia Active Problems:   Essential hypertension   DM2 (diabetes mellitus, type 2) (HCC)   COPD (chronic obstructive pulmonary disease) (HCC)   Iron deficiency anemia    1. Symptomatic anemia due to iron deficiency - 1. Either not absorbing PO iron or she has a very slow chronic GIB 2. 2 unit PRBC transfusion 3. Repeat H/H in AM 2. HTN - 1. Continue home BP meds 3. DM2 - 1. Continue metformin 4. COPD - 1. Continue chronic nebs  DVT prophylaxis: SCDs Code Status: Full Family Communication: No family in room Disposition Plan: Home after admit Consults called: None Admission status: Place in obs   Nihal Marzella, Heywood Iles. DO [facility] Pager [phone]  If 7AM-7PM, please contact day team taking care of patient [email or web address] Password [email or web address]  10/23/2017, 11:28 PM

## 2017-10-24 DIAGNOSIS — N390 Urinary tract infection, site not specified: Secondary | ICD-10-CM | POA: Diagnosis present

## 2017-10-24 DIAGNOSIS — J9611 Chronic respiratory failure with hypoxia: Secondary | ICD-10-CM | POA: Diagnosis present

## 2017-10-24 DIAGNOSIS — Z87891 Personal history of nicotine dependence: Secondary | ICD-10-CM | POA: Diagnosis not present

## 2017-10-24 DIAGNOSIS — Z79899 Other long term (current) drug therapy: Secondary | ICD-10-CM | POA: Diagnosis not present

## 2017-10-24 DIAGNOSIS — R05 Cough: Secondary | ICD-10-CM | POA: Diagnosis present

## 2017-10-24 DIAGNOSIS — F419 Anxiety disorder, unspecified: Secondary | ICD-10-CM | POA: Diagnosis present

## 2017-10-24 DIAGNOSIS — D509 Iron deficiency anemia, unspecified: Secondary | ICD-10-CM | POA: Diagnosis present

## 2017-10-24 DIAGNOSIS — E785 Hyperlipidemia, unspecified: Secondary | ICD-10-CM | POA: Diagnosis present

## 2017-10-24 DIAGNOSIS — Z9981 Dependence on supplemental oxygen: Secondary | ICD-10-CM | POA: Diagnosis not present

## 2017-10-24 DIAGNOSIS — Z7982 Long term (current) use of aspirin: Secondary | ICD-10-CM | POA: Diagnosis not present

## 2017-10-24 DIAGNOSIS — E119 Type 2 diabetes mellitus without complications: Secondary | ICD-10-CM | POA: Diagnosis present

## 2017-10-24 DIAGNOSIS — D649 Anemia, unspecified: Secondary | ICD-10-CM | POA: Diagnosis not present

## 2017-10-24 DIAGNOSIS — I10 Essential (primary) hypertension: Secondary | ICD-10-CM | POA: Diagnosis present

## 2017-10-24 DIAGNOSIS — J449 Chronic obstructive pulmonary disease, unspecified: Secondary | ICD-10-CM

## 2017-10-24 DIAGNOSIS — R531 Weakness: Secondary | ICD-10-CM | POA: Diagnosis not present

## 2017-10-24 DIAGNOSIS — I252 Old myocardial infarction: Secondary | ICD-10-CM | POA: Diagnosis not present

## 2017-10-24 DIAGNOSIS — I2510 Atherosclerotic heart disease of native coronary artery without angina pectoris: Secondary | ICD-10-CM | POA: Diagnosis present

## 2017-10-24 LAB — CBC
HCT: 30 % — ABNORMAL LOW (ref 36.0–46.0)
HEMOGLOBIN: 9.2 g/dL — AB (ref 12.0–15.0)
MCH: 23.9 pg — ABNORMAL LOW (ref 26.0–34.0)
MCHC: 30.7 g/dL (ref 30.0–36.0)
MCV: 77.9 fL — ABNORMAL LOW (ref 78.0–100.0)
Platelets: 197 10*3/uL (ref 150–400)
RBC: 3.85 MIL/uL — AB (ref 3.87–5.11)
RDW: 18.8 % — ABNORMAL HIGH (ref 11.5–15.5)
WBC: 7.9 10*3/uL (ref 4.0–10.5)

## 2017-10-24 LAB — ABO/RH: ABO/RH(D): A POS

## 2017-10-24 LAB — URINALYSIS, ROUTINE W REFLEX MICROSCOPIC
Bilirubin Urine: NEGATIVE
Glucose, UA: NEGATIVE mg/dL
Hgb urine dipstick: NEGATIVE
KETONES UR: NEGATIVE mg/dL
Nitrite: NEGATIVE
PROTEIN: NEGATIVE mg/dL
SQUAMOUS EPITHELIAL / LPF: NONE SEEN
Specific Gravity, Urine: 1.012 (ref 1.005–1.030)
pH: 6 (ref 5.0–8.0)

## 2017-10-24 LAB — GLUCOSE, CAPILLARY: Glucose-Capillary: 114 mg/dL — ABNORMAL HIGH (ref 65–99)

## 2017-10-24 MED ORDER — DEXTROSE 5 % IV SOLN
1.0000 g | Freq: Every day | INTRAVENOUS | Status: DC
  Administered 2017-10-24 – 2017-10-25 (×2): 1 g via INTRAVENOUS
  Filled 2017-10-24 (×2): qty 10

## 2017-10-24 MED ORDER — ALBUTEROL SULFATE (2.5 MG/3ML) 0.083% IN NEBU
INHALATION_SOLUTION | RESPIRATORY_TRACT | Status: AC
  Filled 2017-10-24: qty 3

## 2017-10-24 MED ORDER — SODIUM CHLORIDE 0.9 % IV SOLN
510.0000 mg | Freq: Once | INTRAVENOUS | Status: AC
  Administered 2017-10-24: 510 mg via INTRAVENOUS
  Filled 2017-10-24: qty 17

## 2017-10-24 MED ORDER — IPRATROPIUM-ALBUTEROL 0.5-2.5 (3) MG/3ML IN SOLN
3.0000 mL | Freq: Two times a day (BID) | RESPIRATORY_TRACT | Status: DC
  Administered 2017-10-24 – 2017-10-25 (×2): 3 mL via RESPIRATORY_TRACT
  Filled 2017-10-24 (×2): qty 3

## 2017-10-24 MED ORDER — ALBUTEROL SULFATE (2.5 MG/3ML) 0.083% IN NEBU
2.5000 mg | INHALATION_SOLUTION | RESPIRATORY_TRACT | Status: DC | PRN
  Administered 2017-10-24: 2.5 mg via RESPIRATORY_TRACT

## 2017-10-24 NOTE — Progress Notes (Signed)
PROGRESS NOTE  Karen Hunter  ZOX:[id number] DOB: [birth year] DOA: 10/23/2017 PCP: Alysia Penna, MD  Brief Narrative:   Shelly Spenser Marcotte is a 84 y.o. female with medical history significant of DM2, CAD on ASA, HTN, and iron def anemia for which she previously declined endoscopy.  She presents with a 1 month history of progressive weakness and poor appetite.  She was diagnosed with pneumonia and treated with levofloxacin a month ago, but did not fully recover.  Found to have UTI and severe anemia.  Has intermittently dark stools.  Was transfused 2 units PRBC with robust response in hemoglobin.  Also given feraheme.  PT worked with her late today.  Will repeat CBC in AM.  Not eating much.  Nutrition consult and supplements ordered.  Reevaluate tomorrow.   Assessment & Plan:  Symptomatic anemia due to iron deficiency and probable chronic GI losses.  Although our occult stool was negative, previous occult stools have been positive according to daughter.   -  2 units PRBC transfused -  feraheme x 1 -  Continue oral iron supplementation  CAD, stable, continue ASA and statin  Diabetes mellitus type 2, stable, continue metformin  COPD with chronic respiratory failure with hypoxia, stable, continue home oxygen, pulmicort and duonebs.  Patient also normally on brovana and formoterol nebs.  Will resume these at discharge  Generalized weakness - PT eval:  Declined PT follow up  Possible UTI  -  Start ceftriaxone -  Add urine culture  Memory loss, stable, continue aricept, lexapro  DVT prophylaxis:  SCDs Code Status:  full Family Communication:  Patient and her daughter  Disposition Plan:  Anticipate discharge back to her home tomorrow   Consultants:   none  Procedures:  none  Antimicrobials:  Anti-infectives (From admission, onward)   Start     Dose/Rate Route Frequency Ordered Stop   10/24/17 0830  cefTRIAXone (ROCEPHIN) 1 g in dextrose 5 % 50 mL IVPB     1 g 100 mL/hr over 30  Minutes Intravenous Daily 10/24/17 0802         Subjective:  Still feeling weak and fatigued.  Denies SOB, on home level of oxygen.  Having nausea and poor appetite.  Denies dysuria or increased urinary frequency or urgency.  Having some right flank pain  Objective: Vitals:   10/24/17 0900 10/24/17 1211 10/24/17 1500 10/24/17 1617  BP: (!) 113/57  (!) 119/55 101/81  Pulse: 64  66 73  Resp: 18  16 20   Temp: 98.4 F (36.9 C)  98.2 F (36.8 C) 98.2 F (36.8 C)  TempSrc: Oral  Oral Oral  SpO2: 100% 99% 99% 98%  Weight:      Height:        Intake/Output Summary (Last 24 hours) at 10/24/2017 1812 Last data filed at 10/24/2017 1500 Gross per 24 hour  Intake 1480 ml  Output 600 ml  Net 880 ml   Filed Weights   10/23/17 1545 10/23/17 2102  Weight: 49.4 kg (109 lb) 46.5 kg (102 lb 8 oz)    Examination:  General exam:  Adult female, pale appearing.  No acute distress.  HEENT:  NCAT, MMM Respiratory system: Clear to auscultation bilaterally Cardiovascular system: Regular rate and rhythm, normal S1/S2. No murmurs, rubs, gallops or clicks.  Warm extremities Gastrointestinal system: Normal active bowel sounds, soft, nondistended, nontender.  Positive right flank pain.   MSK:  Normal tone and bulk, no lower extremity edema Neuro:  Grossly moves all extremities.  Able to sit up in bed unassisted    Data Reviewed: I have personally reviewed following labs and imaging studies  CBC: Recent Labs  Lab 10/23/17 1606 10/24/17 1242  WBC 7.4 7.9  HGB 5.6* 9.2*  HCT 21.3* 30.0*  MCV 75.5* 77.9*  PLT 322 197   Basic Metabolic Panel: Recent Labs  Lab 10/23/17 1606  NA 137  K 4.0  CL 101  CO2 27  GLUCOSE 251*  BUN 10  CREATININE 0.82  CALCIUM 8.8*   GFR: Estimated Creatinine Clearance: 36.7 mL/min (by C-G formula based on SCr of 0.82 mg/dL). Liver Function Tests: Recent Labs  Lab 10/23/17 1606  AST 22  ALT 10*  ALKPHOS 45  BILITOT 0.2*  PROT 6.0*  ALBUMIN 3.1*    Recent Labs  Lab 10/23/17 1606  LIPASE 22   No results for input(s): AMMONIA in the last 168 hours. Coagulation Profile: Recent Labs  Lab 10/23/17 1606  INR 1.03   Cardiac Enzymes: Recent Labs  Lab 10/23/17 1606  TROPONINI <0.03   BNP (last 3 results) No results for input(s): PROBNP in the last 8760 hours. HbA1C: No results for input(s): HGBA1C in the last 72 hours. CBG: Recent Labs  Lab 10/24/17 1702  GLUCAP 114*   Lipid Profile: No results for input(s): CHOL, HDL, LDLCALC, TRIG, CHOLHDL, LDLDIRECT in the last 72 hours. Thyroid Function Tests: No results for input(s): TSH, T4TOTAL, FREET4, T3FREE, THYROIDAB in the last 72 hours. Anemia Panel: Recent Labs    10/23/17 1606 10/23/17 1807  VITAMINB12  --  755  FOLATE  --  21.1  FERRITIN  --  7*  TIBC  --  311  IRON  --  8*  RETICCTPCT 4.1*  --    Urine analysis:    Component Value Date/Time   COLORURINE YELLOW 10/24/2017 0318   APPEARANCEUR CLEAR 10/24/2017 0318   LABSPEC 1.012 10/24/2017 0318   PHURINE 6.0 10/24/2017 0318   GLUCOSEU NEGATIVE 10/24/2017 0318   HGBUR NEGATIVE 10/24/2017 0318   BILIRUBINUR NEGATIVE 10/24/2017 0318   KETONESUR NEGATIVE 10/24/2017 0318   PROTEINUR NEGATIVE 10/24/2017 0318   UROBILINOGEN 0.2 08/09/2015 1700   NITRITE NEGATIVE 10/24/2017 0318   LEUKOCYTESUR MODERATE (A) 10/24/2017 0318   Sepsis Labs: @LABRCNTIP (procalcitonin:4,lacticidven:4)  )No results found for this or any previous visit (from the past 240 hour(s)).    Radiology Studies: Dg Chest 2 View  Result Date: 10/23/2017 CLINICAL DATA:  Cough, nausea, vomiting, diarrhea and weakness for 3 days. Recent diagnosis of pneumonia. Former smoker. EXAM: CHEST  2 VIEW COMPARISON:  Chest radiograph October 26, 2016 FINDINGS: Cardiomediastinal silhouette is normal. Calcified aortic knob. Increased lung volumes with chronic interstitial changes and strandy densities in lung bases. No pleural effusion or focal  consolidation. No pneumothorax. Mild degenerative change of the thoracic spine. Advanced RIGHT acromioclavicular osteoarthrosis. IMPRESSION: COPD with bibasilar scarring/fibrosis. Aortic Atherosclerosis (ICD10-I70.0) and Emphysema (ICD10-J43.9). Electronically Signed   By: Awilda Metro M.D.   On: 10/23/2017 16:51     Scheduled Meds: . aspirin EC  81 mg Oral Daily  . budesonide  0.5 mg Nebulization BID  . donepezil  5 mg Oral Daily  . escitalopram  10 mg Oral Daily  . famotidine  20 mg Oral QHS  . feeding supplement (ENSURE ENLIVE)  237 mL Oral BID BM  . ferrous sulfate  300 mg Oral Daily  . ipratropium-albuterol  3 mL Inhalation BID  . metFORMIN  500 mg Oral BID WC  . multivitamin  15 mL  Oral Daily  . simvastatin  10 mg Oral Daily  . traZODone  50 mg Oral QHS  . vitamin E  400 Units Oral Daily   Continuous Infusions: . cefTRIAXone (ROCEPHIN)  IV Stopped (10/24/17 1234)     LOS: 0 days    Time spent: 30 min    Renae FickleMackenzie Joby Richart, MD [facility] Pager [phone]  If 7PM-7AM, please contact night-coverage [email or web address] Password [email or web address] 10/24/2017, 6:12 PM

## 2017-10-24 NOTE — Progress Notes (Signed)
Initial Nutrition Assessment  INTERVENTION:   Continue Ensure Enlive po BID, each supplement provides 350 kcal and 20 grams of protein RD will monitor for any needs  NUTRITION DIAGNOSIS:   Inadequate oral intake related to poor appetite as evidenced by per patient/family report.  GOAL:   Patient will meet greater than or equal to 90% of their needs  MONITOR:   Supplement acceptance, PO intake, Labs, Weight trends, I & O's  REASON FOR ASSESSMENT:   Malnutrition Screening Tool    ASSESSMENT:    84 y.o. female with medical history significant of DM2, CAD on ASA, HTN, looks like iron def anemia (someone put her on PO iron anyhow).  [facility]/o fatigue for > 1 month.  Worsening, onset was insidious.  Decreased PO intake through at least Dec.  Does have chronically dark stools but attributes this to PO iron she takes.  Pt report decreased intake related to fatigue and poor appetite for >1 month. Pt is on a CHO modified diet. Pt is drinking Ensure supplements. Ordered a biscuit and fruit for breakfast and BBQ, cole slaw, sweet potato casserole and fruit for lunch. Pt denies N/V PTA.   Per chart review, pt has lost 7 lb since 07/18/17 (6% wt loss x 3 months, insignificant for time frame).   Labs reviewed. Medications: Ferrous sulfate syrup daily, Liquid MVI daily, Vitamin E capsule daily  NUTRITION - FOCUSED PHYSICAL EXAM:  No findings.  Diet Order:  Diet Carb Modified Fluid consistency: Thin; Room service appropriate? Yes  EDUCATION NEEDS:   No education needs have been identified at this time  Skin:  Skin Assessment: Reviewed RN Assessment  Last BM:  PTA  Height:   Ht Readings from Last 1 Encounters:  10/23/17 5' (1.524 m)    Weight:   Wt Readings from Last 1 Encounters:  10/23/17 102 lb 8 oz (46.5 kg)    Ideal Body Weight:  45.5 kg  BMI:  Body mass index is 20.02 kg/m.  Estimated Nutritional Needs:   Kcal:  1150-1350  Protein:   55-65g  Fluid:  1.5L/day  Tilda FrancoLindsey Jarick Harkins, MS, RD, LDN [facility] Clinical Dietitian Pager: [phone] After Hours Pager: [phone]

## 2017-10-24 NOTE — Evaluation (Signed)
Physical Therapy Evaluation Patient Details Name: Karen Hunter MRN: [id number] DOB: 15-Oct-1931 Today's Date: 10/24/2017   History of Present Illness  84 yo female admitted with anemia. Hx of COPD-O2 dep, DM, CAD, HTN  Clinical Impression  On eval, pt required Min assist for mobility. She walked ~20 feet in room without a device. She is mildly unsteady with ambulation. O2 sat 92 % on 3L [employer]. Pt reported she was a little fatigued after the short walk. Will follow and progressa activity as tolerated. Discussed d/c plan-pt stated she plans to return home. She politely declines HHPT f/u.     Follow Up Recommendations No PT follow up;Supervision - Intermittent (pt politely declines PT f/u)    Equipment Recommendations  None recommended by PT    Recommendations for Other Services       Precautions / Restrictions Precautions Precautions: Fall Precaution Comments: monitor O2  Restrictions Weight Bearing Restrictions: No      Mobility  Bed Mobility Overal bed mobility: Needs Assistance Bed Mobility: Supine to Sit;Sit to Supine           General bed mobility comments: supervision for lines  Transfers Overall transfer level: Needs assistance   Transfers: Sit to/from Stand Sit to Stand: Min guard         General transfer comment: close guard for safety.   Ambulation/Gait Ambulation/Gait assistance: Min assist Ambulation Distance (Feet): 20 Feet Assistive device: None Gait Pattern/deviations: Step-through pattern;Drifts right/left;Staggering right;Staggering left     General Gait Details: Unsteady. Pt held on to room furniture for stability. O2 sat 92% on 3L [place].   Stairs            Wheelchair Mobility    Modified Rankin (Stroke Patients Only)       Balance Overall balance assessment: Needs assistance           Standing balance-Leahy Scale: Fair                               Pertinent Vitals/Pain Pain Assessment: No/denies pain     Home Living Family/patient expects to be discharged to:: Private residence Living Arrangements: Children   Type of Home: House Home Access: Stairs to enter Entrance Stairs-Rails: Right Entrance Stairs-Number of Steps: 2 Home Layout: One level Home Equipment: (oxygen)      Prior Function Level of Independence: Needs assistance   Gait / Transfers Assistance Needed: independent per pt  ADL's / Homemaking Assistance Needed: daughter assists with bathing and household tasks        Hand Dominance        Extremity/Trunk Assessment   Upper Extremity Assessment Upper Extremity Assessment: Generalized weakness    Lower Extremity Assessment Lower Extremity Assessment: Generalized weakness    Cervical / Trunk Assessment Cervical / Trunk Assessment: Normal  Communication   Communication: No difficulties  Cognition Arousal/Alertness: Awake/alert Behavior During Therapy: WFL for tasks assessed/performed Overall Cognitive Status: Within Functional Limits for tasks assessed                                        General Comments      Exercises     Assessment/Plan    PT Assessment Patient needs continued PT services  PT Problem List Decreased balance;Decreased mobility;Decreased activity tolerance       PT Treatment Interventions Gait training;Functional mobility training;Therapeutic  activities;Balance training;Therapeutic exercise;Patient/family education    PT Goals (Current goals can be found in the Care Plan section)  Acute Rehab PT Goals Patient Stated Goal: home soon PT Goal Formulation: With patient Time For Goal Achievement: 11/07/17 Potential to Achieve Goals: Good    Frequency Min 3X/week   Barriers to discharge        Co-evaluation               AM-PAC PT "6 Clicks" Daily Activity  Outcome Measure Difficulty turning over in bed (including adjusting bedclothes, sheets and blankets)?: None Difficulty moving from lying on  back to sitting on the side of the bed? : None Difficulty sitting down on and standing up from a chair with arms (e.g., wheelchair, bedside commode, etc,.)?: A Little Help needed moving to and from a bed to chair (including a wheelchair)?: A Little Help needed walking in hospital room?: A Little Help needed climbing 3-5 steps with a railing? : A Little 6 Click Score: 20    End of Session Equipment Utilized During Treatment: Oxygen Activity Tolerance: Patient tolerated treatment well Patient left: in bed;with call bell/phone within reach;with bed alarm set   PT Visit Diagnosis: Difficulty in walking, not elsewhere classified (R26.2)    Time: [id number] PT Time Calculation (min) (ACUTE ONLY): 18 min   Charges:   PT Evaluation $PT Eval Moderate Complexity: 1 Mod     PT G Codes:          Rebeca AlertJannie Beyonca Wisz, MPT Pager: [phone]

## 2017-10-24 NOTE — Progress Notes (Signed)
Inpatient Diabetes Program Recommendations  AACE/ADA: New Consensus Statement on Inpatient Glycemic Control (2015)  Target Ranges:  Prepandial:   less than 140 mg/dL      Peak postprandial:   less than 180 mg/dL (1-2 hours)      Critically ill patients:  140 - 180 mg/dL   Lab Results  Component Value Date   GLUCAP 192 (H) 07/21/2015   HGBA1C 8.0 (H) 07/19/2015    Review of Glycemic Control  Diabetes history: DM2 Outpatient Diabetes medications: metformin 500 mg bid Current orders for Inpatient glycemic control: metformin 500 mg bid  Needs correction insulin.  Inpatient Diabetes Program Recommendations:    Add Novolog 0-9 units tidwc.  Will follow.  Thank you. Ailene Ardshonda Akia Montalban, RD, LDN, CDE Inpatient Diabetes Coordinator [phone]

## 2017-10-24 NOTE — Progress Notes (Signed)
Pharmacy Antibiotic Note  Karen Hunter is a 84 y.o. female admitted on 10/23/2017 with UTI.  Pharmacy has been consulted for Ceftriaxone dosing.  Plan:  Ceftriaxone 1g IV q24h  Dosage remains stable and need for further dosage adjustment appears unlikely at present.  Pharmacy will sign off at this time.  Please reconsult if a change in clinical status warrants re-evaluation of dosage.    Height: 5' (152.4 cm) Weight: 102 lb 8 oz (46.5 kg) IBW/kg (Calculated) : 45.5  Temp (24hrs), Avg:99 F (37.2 C), Min:98.2 F (36.8 C), Max:99.9 F (37.7 C)  Recent Labs  Lab 10/23/17 1606  WBC 7.4  CREATININE 0.82    Estimated Creatinine Clearance: 36.7 mL/min (by C-G formula based on SCr of 0.82 mg/dL).    Allergies  Allergen Reactions  . Inh [Isoniazid] Other (See Comments)    hepatitis  . Naproxen Hives    Antimicrobials this admission: 1/10 >> Ceftriaxone >>  Dose adjustments this admission:   Microbiology results: 1/10 UCx:   Thank you for allowing pharmacy to be a part of this patient's care.  Lynann Beaverhristine Ceylin Dreibelbis PharmD, BCPS Pager [phone] 10/24/2017 8:02 AM

## 2017-10-24 NOTE — Care Management Obs Status (Signed)
MEDICARE OBSERVATION STATUS NOTIFICATION   Patient Details  Name: Karen Hunter MRN: [id number] Date of Birth: 06/05/1931   Medicare Observation Status Notification Given:  Yes    Geni BersMcGibboney, Abbigael Detlefsen, RN 10/24/2017, 3:45 PM

## 2017-10-25 LAB — TYPE AND SCREEN
ABO/RH(D): A POS
Antibody Screen: NEGATIVE
Unit division: 0
Unit division: 0

## 2017-10-25 LAB — BPAM RBC
Blood Product Expiration Date: 201901292359
Blood Product Expiration Date: 201901302359
ISSUE DATE / TIME: 201901100500
ISSUE DATE / TIME: 201901100001
UNIT TYPE AND RH: 6200
Unit Type and Rh: 6200

## 2017-10-25 LAB — CBC
HCT: 34.3 % — ABNORMAL LOW (ref 36.0–46.0)
Hemoglobin: 10.2 g/dL — ABNORMAL LOW (ref 12.0–15.0)
MCH: 23.5 pg — AB (ref 26.0–34.0)
MCHC: 29.7 g/dL — AB (ref 30.0–36.0)
MCV: 79 fL (ref 78.0–100.0)
PLATELETS: 220 10*3/uL (ref 150–400)
RBC: 4.34 MIL/uL (ref 3.87–5.11)
RDW: 19.3 % — ABNORMAL HIGH (ref 11.5–15.5)
WBC: 8 10*3/uL (ref 4.0–10.5)

## 2017-10-25 LAB — URINE CULTURE: Culture: <10000 — AB

## 2017-10-25 LAB — GLUCOSE, CAPILLARY: Glucose-Capillary: 95 mg/dL (ref 65–99)

## 2017-10-25 LAB — OCCULT BLOOD X 1 CARD TO LAB, STOOL: FECAL OCCULT BLD: NEGATIVE

## 2017-10-25 MED ORDER — CEPHALEXIN 250 MG/5ML PO SUSR: 500.0000 mg | Freq: Two times a day (BID) | ORAL | 0 refills | Status: AC

## 2017-10-25 MED ORDER — GLUCERNA ADVANCE SHAKE PO LIQD: 1.0000 | Freq: Two times a day (BID) | ORAL | 0 refills | Status: AC

## 2017-10-25 MED ORDER — FERROUS SULFATE 220 (44 FE) MG/5ML PO LIQD: 5.0000 mL | Freq: Two times a day (BID) | ORAL | 0 refills | Status: AC

## 2017-10-25 NOTE — Progress Notes (Signed)
Physical Therapy Treatment Patient Details Name: Karen RouxBetty J Hunter MRN: [id number] DOB: 08/17/1931 Today's Date: 10/25/2017    History of Present Illness 84 yo female admitted with anemia. Hx of COPD-O2 dep, DM, CAD, HTN    PT Comments    Progressing with mobility. Pt remains unsteady. Discussed d/c plan with pt and daughter. Pt will return home with daughter. Pt is now agreeable to HHPT and a RW.    Follow Up Recommendations  Home health PT;Supervision/Assistance - 24 hour     Equipment Recommendations  Rolling walker with 5" wheels    Recommendations for Other Services       Precautions / Restrictions Precautions Precautions: Fall Precaution Comments: monitor O2  Restrictions Weight Bearing Restrictions: No    Mobility  Bed Mobility Overal bed mobility: Needs Assistance Bed Mobility: Supine to Sit     Supine to sit: Supervision     General bed mobility comments: supervision for lines  Transfers Overall transfer level: Needs assistance Equipment used: None Transfers: Sit to/from Stand Sit to Stand: Min assist         General transfer comment: Assist to steady.   Ambulation/Gait Ambulation/Gait assistance: Min assist Ambulation Distance (Feet): 60 Feet Assistive device: None;1 person hand held assist Gait Pattern/deviations: Step-through pattern;Decreased stride length     General Gait Details: Unsteady. Pt held on to room furniture/daughter's hand for stability. O2 sat 90% on 3L [place]. Dyspnea 2/4   Stairs            Wheelchair Mobility    Modified Rankin (Stroke Patients Only)       Balance Overall balance assessment: Needs assistance           Standing balance-Leahy Scale: Fair                              Cognition Arousal/Alertness: Awake/alert Behavior During Therapy: WFL for tasks assessed/performed Overall Cognitive Status: Within Functional Limits for tasks assessed                                         Exercises      General Comments        Pertinent Vitals/Pain Pain Assessment: No/denies pain    Home Living                      Prior Function            PT Goals (current goals can now be found in the care plan section) Progress towards PT goals: Progressing toward goals    Frequency    Min 3X/week      PT Plan Current plan remains appropriate    Co-evaluation              AM-PAC PT "6 Clicks" Daily Activity  Outcome Measure  Difficulty turning over in bed (including adjusting bedclothes, sheets and blankets)?: None Difficulty moving from lying on back to sitting on the side of the bed? : None Difficulty sitting down on and standing up from a chair with arms (e.g., wheelchair, bedside commode, etc,.)?: A Little Help needed moving to and from a bed to chair (including a wheelchair)?: A Little Help needed walking in hospital room?: A Little Help needed climbing 3-5 steps with a railing? : A Little 6 Click Score: 20    End of  Session Equipment Utilized During Treatment: Oxygen;Gait belt Activity Tolerance: Patient tolerated treatment well Patient left: in chair;with call bell/phone within reach;with family/visitor present   PT Visit Diagnosis: Muscle weakness (generalized) (M62.81);Difficulty in walking, not elsewhere classified (R26.2)     Time: 1130-1147 PT Time Calculation (min) (ACUTE ONLY): 17 min  Charges:  $Gait Training: 8-22 mins                    G Codes:          Rebeca Alert, MPT Pager: [phone]

## 2017-10-25 NOTE — Care Management Note (Signed)
Case Management Note  Patient Details  Name: Karen Hunter MRN: [id number] Date of Birth: 08/01/1931  Subjective/Objective:                    Action/Plan:Home with [facility]   Expected Discharge Date:  10/25/17               Expected Discharge Plan:  Home/Self Care  In-House Referral:     Discharge planning Services  CM Consult  Post Acute Care Choice:    Choice offered to:     DME Arranged:    DME Agency:     HH Arranged:  RN, PT HH Agency:  [facility]  Status of Service:  Completed, signed off  If discussed at Long Length of Stay Meetings, dates discussed:    Additional CommentsGeni Hunter:  Karen Millette, RN 10/25/2017, 3:29 PM

## 2017-10-25 NOTE — Discharge Summary (Signed)
Physician Discharge Summary  Karen Hunter WUJ:[id number] DOB: 10-04-1931 DOA: 10/23/2017  PCP: Alysia Penna, MD  Admit date: 10/23/2017 Discharge date: 10/25/2017  Admitted From: home  Disposition:  home  Recommendations for Outpatient Follow-up:  1. Follow up with PCP in 1-2 weeks for repeat CBC.  Consider close outpatient monitoring with more frequent iron infusions to help reduce need for hospitalizations.  Referral to GI if patient would like more definitive evaluation  Home Health:  PT, RN  Equipment/Devices:  Rolling walker  Discharge Condition:  Stable, improved CODE STATUS:  Full code  Diet recommendation:  regular   Brief/Interim Summary:  Karen Grzesiak Dvorskyis a 84 y.o.femalewith medical history significant ofDM2, CAD on ASA, HTN, and iron def anemia for which she previously declined endoscopy.  She presents with a 1 month history of progressive weakness and poor appetite.  She was diagnosed with pneumonia and treated with levofloxacin a month ago, but did not fully recover.  During this hospitalization, she was found to have UTI and severe anemia.  She endorses intermittently dark stools, even prior to starting iron.  Was transfused 2 units PRBC with robust response in hemoglobin.  Also given feraheme.  PT recommended HH PT.  Her appetite and energy improved with blood transfusion.  She was started on supplements and will follow up with her PCP for more regular monitoring of her anemia.     Discharge Diagnoses:  Principal Problem:   Symptomatic anemia Active Problems:   Essential hypertension   DM2 (diabetes mellitus, type 2) (HCC)   COPD (chronic obstructive pulmonary disease) (HCC)   Iron deficiency anemia   Anemia  Symptomatic anemia due to iron deficiency and probable chronic GI losses.  Although our occult stool was repeatedly negative, previous occult stools have been positive according to daughter.  Her history suggests intermittent bleeding.    -  2 units PRBC  transfused -  feraheme x 1 -  increased her oral iron supplementation to BID  CAD, stable, continued ASA and statin  Diabetes mellitus type 2, stable, continue metformin  COPD with chronic respiratory failure with hypoxia, stable, continue home oxygen, pulmicort and duonebs. Continued brovana and formoterol nebs.   Generalized weakness - PT eval:  recommended HH PT and rolling walker  Possible UTI with moderate leukocytes, TNTC WBC, nausea and weakness.   -  keflex x 7 days -  Urine culture < 10000 colonies  (Culture obtained after antibiotics started.)  Memory loss, stable, continue aricept, lexapro   Discharge Instructions  Discharge Instructions    Call MD for:  difficulty breathing, headache or visual disturbances   Complete by:  As directed    Call MD for:  extreme fatigue   Complete by:  As directed    Call MD for:  hives   Complete by:  As directed    Call MD for:  persistant dizziness or light-headedness   Complete by:  As directed    Call MD for:  persistant nausea and vomiting   Complete by:  As directed    Call MD for:  severe uncontrolled pain   Complete by:  As directed    Call MD for:  temperature >100.4   Complete by:  As directed    Diet Carb Modified   Complete by:  As directed    Increase activity slowly   Complete by:  As directed      Allergies as of 10/25/2017      Reactions   Inh [isoniazid] Other (  See Comments)   hepatitis   Naproxen Hives      Medication List    TAKE these medications   alendronate 70 MG tablet Commonly known as:  FOSAMAX Take 70 mg by mouth once a week. On Sunday   aspirin 81 MG tablet Take 81 mg by mouth daily.   budesonide 0.5 MG/2ML nebulizer solution Commonly known as:  PULMICORT Take 0.5 mg by nebulization 2 (two) times daily.   cephALEXin 250 MG/5ML suspension Commonly known as:  KEFLEX Take 10 mLs (500 mg total) by mouth 2 (two) times daily for 6 days.   diclofenac sodium 1 % Gel Commonly known  as:  VOLTAREN Apply 2 g topically 4 (four) times daily.   donepezil 5 MG tablet Commonly known as:  ARICEPT Take 5 mg by mouth daily.   escitalopram 10 MG tablet Commonly known as:  LEXAPRO Take 10 mg by mouth daily.   famotidine 20 MG tablet Commonly known as:  PEPCID Take 20 mg by mouth at bedtime.   Ferrous Sulfate 220 (44 Fe) MG/5ML Liqd Take 5 mLs by mouth 2 (two) times daily. What changed:  when to take this   GLUCERNA ADVANCE SHAKE Liqd Take 1 Can by mouth 2 (two) times daily between meals.   guaiFENesin 600 MG 12 hr tablet Commonly known as:  MUCINEX Take 600 mg by mouth as needed.   ipratropium-albuterol 0.5-2.5 (3) MG/3ML Soln Commonly known as:  DUONEB Inhale 3 mLs into the lungs 3 (three) times daily.   metFORMIN 500 MG tablet Commonly known as:  GLUCOPHAGE Take 500 mg by mouth 2 (two) times daily with a meal.   multivitamin with iron-minerals liquid Take 5 mLs by mouth daily.   simvastatin 10 MG tablet Commonly known as:  ZOCOR Take 10 mg by mouth daily.   traZODone 50 MG tablet Commonly known as:  DESYREL Take 50 mg by mouth at bedtime.   vitamin E 400 UNIT capsule Take 400 Units by mouth daily.      Follow-up Information    Alysia Penna, MD. Schedule an appointment as soon as possible for a visit in 1 week(s).   Specialty:  Internal Medicine Contact information: [address] [place] [address] [phone]          Allergies  Allergen Reactions  . Inh [Isoniazid] Other (See Comments)    hepatitis  . Naproxen Hives    Consultations: none   Procedures/Studies: Dg Chest 2 View  Result Date: 10/23/2017 CLINICAL DATA:  Cough, nausea, vomiting, diarrhea and weakness for 3 days. Recent diagnosis of pneumonia. Former smoker. EXAM: CHEST  2 VIEW COMPARISON:  Chest radiograph October 26, 2016 FINDINGS: Cardiomediastinal silhouette is normal. Calcified aortic knob. Increased lung volumes with chronic interstitial changes and  strandy densities in lung bases. No pleural effusion or focal consolidation. No pneumothorax. Mild degenerative change of the thoracic spine. Advanced RIGHT acromioclavicular osteoarthrosis. IMPRESSION: COPD with bibasilar scarring/fibrosis. Aortic Atherosclerosis (ICD10-I70.0) and Emphysema (ICD10-J43.9). Electronically Signed   By: Awilda Metro M.D.   On: 10/23/2017 16:51      Subjective: Feeling much better.  Ate all her breakfast, energy is better.  Feels ready to go home.  Denies lightheadedness, SOB.    Discharge Exam: Vitals:   10/25/17 0442 10/25/17 0902  BP: 121/65   Pulse: 87   Resp: 20   Temp: 98.5 F (36.9 C)   SpO2: 94% 98%   Vitals:   10/24/17 2035 10/24/17 2048 10/25/17 0442 10/25/17 0902  BP: Marland Kitchen)  122/49  121/65   Pulse: 68  87   Resp: 20  20   Temp: 98.6 F (37 C)  98.5 F (36.9 C)   TempSrc: Oral  Oral   SpO2: 99% 98% 94% 98%  Weight:      Height:        General: Pt is alert, awake, not in acute distress, sitting in chair Cardiovascular: RRR, S1/S2 +, no rubs, no gallops Respiratory: CTA bilaterally, no wheezing, no rhonchi Abdominal: Soft, NT, ND, bowel sounds + Extremities: no edema, no cyanosis   The results of significant diagnostics from this hospitalization (including imaging, microbiology, ancillary and laboratory) are listed below for reference.     Microbiology: Recent Results (from the past 240 hour(s))  Culture, Urine     Status: Abnormal   Collection Time: 10/24/17  7:56 AM  Result Value Ref Range Status   Specimen Description URINE, CLEAN CATCH  Final   Special Requests NONE  Final   Culture (A)  Final    <10,000 COLONIES/mL INSIGNIFICANT GROWTH Performed at [facility], [address] [place], [place] [id number]    Report Status 10/25/2017 FINAL  Final     Labs: BNP (last 3 results) No results for input(s): BNP in the last 8760 hours. Basic Metabolic Panel: Recent Labs  Lab 10/23/17 1606  NA 137  K 4.0  CL 101   CO2 27  GLUCOSE 251*  BUN 10  CREATININE 0.82  CALCIUM 8.8*   Liver Function Tests: Recent Labs  Lab 10/23/17 1606  AST 22  ALT 10*  ALKPHOS 45  BILITOT 0.2*  PROT 6.0*  ALBUMIN 3.1*   Recent Labs  Lab 10/23/17 1606  LIPASE 22   No results for input(s): AMMONIA in the last 168 hours. CBC: Recent Labs  Lab 10/23/17 1606 10/24/17 1242 10/25/17 0901  WBC 7.4 7.9 8.0  HGB 5.6* 9.2* 10.2*  HCT 21.3* 30.0* 34.3*  MCV 75.5* 77.9* 79.0  PLT 322 197 220   Cardiac Enzymes: Recent Labs  Lab 10/23/17 1606  TROPONINI <0.03   BNP: Invalid input(s): POCBNP CBG: Recent Labs  Lab 10/23/17 2035 10/24/17 1702  GLUCAP 95 114*   D-Dimer No results for input(s): DDIMER in the last 72 hours. Hgb A1c No results for input(s): HGBA1C in the last 72 hours. Lipid Profile No results for input(s): CHOL, HDL, LDLCALC, TRIG, CHOLHDL, LDLDIRECT in the last 72 hours. Thyroid function studies No results for input(s): TSH, T4TOTAL, T3FREE, THYROIDAB in the last 72 hours.  Invalid input(s): FREET3 Anemia work up Recent Labs    10/23/17 1606 10/23/17 1807  VITAMINB12  --  755  FOLATE  --  21.1  FERRITIN  --  7*  TIBC  --  311  IRON  --  8*  RETICCTPCT 4.1*  --    Urinalysis    Component Value Date/Time   COLORURINE YELLOW 10/24/2017 0318   APPEARANCEUR CLEAR 10/24/2017 0318   LABSPEC 1.012 10/24/2017 0318   PHURINE 6.0 10/24/2017 0318   GLUCOSEU NEGATIVE 10/24/2017 0318   HGBUR NEGATIVE 10/24/2017 0318   BILIRUBINUR NEGATIVE 10/24/2017 0318   KETONESUR NEGATIVE 10/24/2017 0318   PROTEINUR NEGATIVE 10/24/2017 0318   UROBILINOGEN 0.2 08/09/2015 1700   NITRITE NEGATIVE 10/24/2017 0318   LEUKOCYTESUR MODERATE (A) 10/24/2017 0318   Sepsis Labs Invalid input(s): PROCALCITONIN,  WBC,  LACTICIDVEN   Time coordinating discharge: Over 30 minutes  SIGNED:   Renae FickleMackenzie Anelis Hrivnak, MD  [facility] 10/25/2017, 7:45 PM Pager  If 7PM-7AM, please contact  night-coverage [email or web address] Password [email or web address]

## 2017-10-25 NOTE — Progress Notes (Signed)
Discharge instructions and prescriptions given to patient's daughter and family.  Patient's family verbalized understanding of discharge information.  Will proceed with discharge.

## 2017-10-28 DIAGNOSIS — R82998 Other abnormal findings in urine: Secondary | ICD-10-CM | POA: Diagnosis not present

## 2017-10-28 DIAGNOSIS — M859 Disorder of bone density and structure, unspecified: Secondary | ICD-10-CM | POA: Diagnosis not present

## 2017-10-28 DIAGNOSIS — I10 Essential (primary) hypertension: Secondary | ICD-10-CM | POA: Diagnosis not present

## 2017-10-28 DIAGNOSIS — E7849 Other hyperlipidemia: Secondary | ICD-10-CM | POA: Diagnosis not present

## 2017-10-28 DIAGNOSIS — E1165 Type 2 diabetes mellitus with hyperglycemia: Secondary | ICD-10-CM | POA: Diagnosis not present

## 2017-10-30 DIAGNOSIS — J449 Chronic obstructive pulmonary disease, unspecified: Secondary | ICD-10-CM | POA: Diagnosis not present

## 2017-10-31 ENCOUNTER — Encounter (HOSPITAL_BASED_OUTPATIENT_CLINIC_OR_DEPARTMENT_OTHER): Payer: Self-pay

## 2017-10-31 ENCOUNTER — Emergency Department (HOSPITAL_BASED_OUTPATIENT_CLINIC_OR_DEPARTMENT_OTHER)
Admission: EM | Admit: 2017-10-31 | Discharge: 2017-10-31 | Disposition: A | Discharge status: Home / Self Care | Payer: Medicare HMO | Attending: Emergency Medicine | Admitting: Emergency Medicine

## 2017-10-31 ENCOUNTER — Emergency Department (HOSPITAL_BASED_OUTPATIENT_CLINIC_OR_DEPARTMENT_OTHER): Payer: Medicare HMO

## 2017-10-31 ENCOUNTER — Other Ambulatory Visit: Payer: Self-pay

## 2017-10-31 DIAGNOSIS — Z7982 Long term (current) use of aspirin: Secondary | ICD-10-CM | POA: Diagnosis not present

## 2017-10-31 DIAGNOSIS — I10 Essential (primary) hypertension: Secondary | ICD-10-CM | POA: Insufficient documentation

## 2017-10-31 DIAGNOSIS — J449 Chronic obstructive pulmonary disease, unspecified: Secondary | ICD-10-CM | POA: Diagnosis not present

## 2017-10-31 DIAGNOSIS — Z87891 Personal history of nicotine dependence: Secondary | ICD-10-CM | POA: Insufficient documentation

## 2017-10-31 DIAGNOSIS — R05 Cough: Secondary | ICD-10-CM | POA: Diagnosis not present

## 2017-10-31 DIAGNOSIS — Z7984 Long term (current) use of oral hypoglycemic drugs: Secondary | ICD-10-CM | POA: Diagnosis not present

## 2017-10-31 DIAGNOSIS — R5383 Other fatigue: Secondary | ICD-10-CM | POA: Diagnosis not present

## 2017-10-31 DIAGNOSIS — R0602 Shortness of breath: Secondary | ICD-10-CM

## 2017-10-31 DIAGNOSIS — I2510 Atherosclerotic heart disease of native coronary artery without angina pectoris: Secondary | ICD-10-CM | POA: Insufficient documentation

## 2017-10-31 DIAGNOSIS — I252 Old myocardial infarction: Secondary | ICD-10-CM | POA: Insufficient documentation

## 2017-10-31 DIAGNOSIS — E119 Type 2 diabetes mellitus without complications: Secondary | ICD-10-CM | POA: Insufficient documentation

## 2017-10-31 HISTORY — DX: Urinary tract infection, site not specified: N39.0

## 2017-10-31 HISTORY — DX: Pneumonia, unspecified organism: J18.9

## 2017-10-31 HISTORY — DX: Anemia, unspecified: D64.9

## 2017-10-31 LAB — COMPREHENSIVE METABOLIC PANEL
ALBUMIN: 3.3 g/dL — AB (ref 3.5–5.0)
ALK PHOS: 59 U/L (ref 38–126)
ALT: 12 U/L — ABNORMAL LOW (ref 14–54)
ANION GAP: 11 (ref 5–15)
AST: 27 U/L (ref 15–41)
BUN: 14 mg/dL (ref 6–20)
CALCIUM: 9.6 mg/dL (ref 8.9–10.3)
CO2: 30 mmol/L (ref 22–32)
Chloride: 95 mmol/L — ABNORMAL LOW (ref 101–111)
Creatinine, Ser: 0.77 mg/dL (ref 0.44–1.00)
GFR calc non Af Amer: >60 mL/min (ref >60)
GLUCOSE: 181 mg/dL — AB (ref 65–99)
POTASSIUM: 4.1 mmol/L (ref 3.5–5.1)
SODIUM: 136 mmol/L (ref 135–145)
Total Bilirubin: 0.5 mg/dL (ref 0.3–1.2)
Total Protein: 7.3 g/dL (ref 6.5–8.1)

## 2017-10-31 LAB — URINALYSIS, ROUTINE W REFLEX MICROSCOPIC
Bilirubin Urine: NEGATIVE
Glucose, UA: NEGATIVE mg/dL
Hgb urine dipstick: NEGATIVE
KETONES UR: 15 mg/dL — AB
LEUKOCYTES UA: NEGATIVE
NITRITE: NEGATIVE
PH: 7 (ref 5.0–8.0)
PROTEIN: NEGATIVE mg/dL
Specific Gravity, Urine: 1.02 (ref 1.005–1.030)

## 2017-10-31 LAB — CBC WITH DIFFERENTIAL/PLATELET
BASOS ABS: 0 10*3/uL (ref 0.0–0.1)
Basophils Relative: 0 %
EOS ABS: 0.2 10*3/uL (ref 0.0–0.7)
Eosinophils Relative: 4 %
HCT: 39.5 % (ref 36.0–46.0)
Hemoglobin: 11.7 g/dL — ABNORMAL LOW (ref 12.0–15.0)
LYMPHS ABS: 1.1 10*3/uL (ref 0.7–4.0)
Lymphocytes Relative: 20 %
MCH: 24.6 pg — AB (ref 26.0–34.0)
MCHC: 29.6 g/dL — AB (ref 30.0–36.0)
MCV: 83 fL (ref 78.0–100.0)
MONO ABS: 0.6 10*3/uL (ref 0.1–1.0)
Monocytes Relative: 11 %
NEUTROS ABS: 3.7 10*3/uL (ref 1.7–7.7)
Neutrophils Relative %: 65 %
PLATELETS: 243 10*3/uL (ref 150–400)
RBC: 4.76 MIL/uL (ref 3.87–5.11)
RDW: 23.7 % — AB (ref 11.5–15.5)
WBC: 5.6 10*3/uL (ref 4.0–10.5)

## 2017-10-31 LAB — I-STAT CG4 LACTIC ACID, ED
LACTIC ACID, VENOUS: 3.67 mmol/L — AB (ref 0.5–1.9)
Lactic Acid, Venous: 3.79 mmol/L (ref 0.5–1.9)
Lactic Acid, Venous: 3.05 mmol/L (ref 0.5–1.9)

## 2017-10-31 LAB — BRAIN NATRIURETIC PEPTIDE: B Natriuretic Peptide: 54.8 pg/mL (ref 0.0–100.0)

## 2017-10-31 LAB — TROPONIN I

## 2017-10-31 MED ORDER — IPRATROPIUM-ALBUTEROL 0.5-2.5 (3) MG/3ML IN SOLN
3.0000 mL | Freq: Once | RESPIRATORY_TRACT | Status: AC
  Administered 2017-10-31: 3 mL via RESPIRATORY_TRACT
  Filled 2017-10-31: qty 3

## 2017-10-31 MED ORDER — SODIUM CHLORIDE 0.9 % IV BOLUS (SEPSIS)
1000.0000 mL | Freq: Once | INTRAVENOUS | Status: AC
  Administered 2017-10-31: 1000 mL via INTRAVENOUS

## 2017-10-31 NOTE — ED Notes (Signed)
Pt taken off home O2 and placed on wall O2 at 3L [place]-O2 sats increased to 95%

## 2017-10-31 NOTE — ED Notes (Signed)
ED Provider at bedside. 

## 2017-10-31 NOTE — ED Triage Notes (Signed)
Per daughter pt with SOB, fatigue-states she had recent hosp admit for low hgb, is on abx for UTI-pneumonia Dec 2018-pt is pale-O2 sat 83% O2 3L [place]-taken to tx area via w/c

## 2017-10-31 NOTE — Discharge Instructions (Signed)
Your workup today showed no evidence of pneumonia or other infections.  We suspect you are dehydrated.  We gave you some fluids and he felt better.  Please follow-up with your primary care physician in several days.  If any symptoms change or worsen, please return to the nearest emergency department.

## 2017-10-31 NOTE — ED Notes (Signed)
In to see Pt. ... Pt. In radiology at present time.  Pt. Daughter at bedside.

## 2017-10-31 NOTE — ED Notes (Signed)
Pt ambulated with assistance from EMT and RT while on O2@3L . Pt sats ranged from 89%-92% while walking and dropped to 82% by the time the pt returned to the room. HR between 95-103. RN and MD notified.

## 2017-10-31 NOTE — ED Provider Notes (Signed)
[facility] EMERGENCY DEPARTMENT Provider Note   CSN: [id number] Arrival date & time: 10/31/17  1435     History   Chief Complaint Chief Complaint  Patient presents with  . Shortness of Breath    HPI Karen Hunter How is a 84 y.o. female.  The history is provided by the patient, medical records and a relative. No language interpreter was used.  Shortness of Breath  This is a new problem. The problem occurs continuously.The problem has been gradually worsening. Associated symptoms include cough. Pertinent negatives include no fever, no headaches, no rhinorrhea, no neck pain, no sputum production, no hemoptysis, no wheezing, no chest pain, no syncope, no vomiting, no abdominal pain, no rash, no leg pain and no leg swelling. She has tried nothing for the symptoms. The treatment provided no relief. She has had prior hospitalizations. Associated medical issues include asthma, COPD, chronic lung disease, CAD and past MI.    Past Medical History:  Diagnosis Date  . Anemia   . Anxiety   . COPD (chronic obstructive pulmonary disease) (HCC)   . Coronary artery disease   . Diabetes mellitus without complication (HCC)   . Heart attack (HCC) 1993  . Hepatitis    Inh-induced  . Hyperlipidemia   . Hypertension   . Pneumonia   . Short-term memory loss   . UTI (urinary tract infection)     Patient Active Problem List   Diagnosis Date Noted  . Anemia 10/24/2017  . Symptomatic anemia 10/23/2017  . Iron deficiency anemia 10/23/2017  . COPD (chronic obstructive pulmonary disease) (HCC) 10/26/2016  . Pulmonary emphysema (HCC) 10/26/2016  . Chronic respiratory failure (HCC) 10/26/2016  . Short-term memory loss 10/26/2016  . DM2 (diabetes mellitus, type 2) (HCC) 07/18/2015  . Coronary artery disease 10/01/2013  . Essential hypertension 10/01/2013  . Hyperlipidemia 10/01/2013    Past Surgical History:  Procedure Laterality Date  . ABDOMINAL HYSTERECTOMY    . LEFT HEART CATH   1993  . TONSILLECTOMY  age 88    OB History    No data available       Home Medications    Prior to Admission medications   Medication Sig Start Date End Date Taking? Authorizing Provider  alendronate (FOSAMAX) 70 MG tablet Take 70 mg by mouth once a week. On Sunday 07/14/15   [provider]  aspirin 81 MG tablet Take 81 mg by mouth daily.    [provider]  budesonide (PULMICORT) 0.5 MG/2ML nebulizer solution Take 0.5 mg by nebulization 2 (two) times daily.  09/18/16   [provider]  cephALEXin (KEFLEX) 250 MG/5ML suspension Take 10 mLs (500 mg total) by mouth 2 (two) times daily for 6 days. 10/25/17 10/31/17  Renae Fickle, MD  diclofenac sodium (VOLTAREN) 1 % GEL Apply 2 g topically 4 (four) times daily. 04/18/17   Tarry Kos, MD  donepezil (ARICEPT) 5 MG tablet Take 5 mg by mouth daily.  07/13/15   [provider]  escitalopram (LEXAPRO) 10 MG tablet Take 10 mg by mouth daily. 07/15/15   [provider]  famotidine (PEPCID) 20 MG tablet Take 20 mg by mouth at bedtime. 07/31/17   [provider]  Ferrous Sulfate 220 (44 Fe) MG/5ML LIQD Take 5 mLs by mouth 2 (two) times daily. 10/25/17   Renae Fickle, MD  guaiFENesin (MUCINEX) 600 MG 12 hr tablet Take 600 mg by mouth as needed.    [provider]  ipratropium-albuterol (DUONEB) 0.5-2.5 (3) MG/3ML  SOLN Inhale 3 mLs into the lungs 3 (three) times daily.  09/18/16   [provider]  metFORMIN (GLUCOPHAGE) 500 MG tablet Take 500 mg by mouth 2 (two) times daily with a meal.  09/18/16   [provider]  Multiple Vitamins-Minerals (MULTIVITAMIN WITH IRON-MINERALS) liquid Take 5 mLs by mouth daily.    [provider]  Nutritional Supplements (GLUCERNA ADVANCE SHAKE) LIQD Take 1 Can by mouth 2 (two) times daily between meals. 10/25/17   Renae FickleShort, Mackenzie, MD  simvastatin (ZOCOR) 10 MG tablet Take 10 mg by mouth daily. 08/14/13   [provider]    traZODone (DESYREL) 50 MG tablet Take 50 mg by mouth at bedtime. 06/23/15   [provider]  vitamin E 400 UNIT capsule Take 400 Units by mouth daily.    [provider]    Family History Family History  Problem Relation Age of Onset  . Breast cancer Mother   . Lung disease Father   . Emphysema Father   . Breast cancer Maternal Grandmother     Social History Social History   Tobacco Use  . Smoking status: Former Smoker    Packs/day: 1.00    Years: 60.00    Pack years: 60.00    Types: Cigarettes    Start date: 10/16/1949    Last attempt to quit: 02/13/2016    Years since quitting: 1.7  . Smokeless tobacco: Never Used  . Tobacco comment: She quit for only months at time  Substance Use Topics  . Alcohol use: Yes    Comment: occ mixed drinks  . Drug use: No     Allergies   Inh [isoniazid] and Naproxen   Review of Systems Review of Systems  Constitutional: Positive for chills and fatigue. Negative for diaphoresis and fever.  HENT: Negative for congestion and rhinorrhea.   Respiratory: Positive for cough and shortness of breath. Negative for hemoptysis, sputum production, chest tightness, wheezing and stridor.   Cardiovascular: Negative for chest pain, leg swelling and syncope.  Gastrointestinal: Negative for abdominal pain, constipation, diarrhea, nausea and vomiting.  Genitourinary: Negative for dysuria and flank pain.  Musculoskeletal: Negative for back pain, neck pain and neck stiffness.  Skin: Negative for rash.  Neurological: Negative for headaches.  Psychiatric/Behavioral: Negative for agitation.  All other systems reviewed and are negative.    Physical Exam Updated Vital Signs BP (!) 120/58 (BP Location: Right Arm)   Pulse 90   Temp 98.6 F (37 C) (Oral)   Resp (!) 28   Ht 5' (1.524 m)   Wt 46.3 kg (102 lb)   SpO2 (!) 83%   BMI 19.92 kg/m   Physical Exam  Constitutional: She is oriented to person, place, and time. She appears  well-developed and well-nourished. No distress.  HENT:  Head: Normocephalic and atraumatic.  Nose: Nose normal.  Mouth/Throat: No oropharyngeal exudate.  Eyes: Conjunctivae and EOM are normal. Pupils are equal, round, and reactive to light.  Neck: Normal range of motion.  Cardiovascular: Normal rate and intact distal pulses.  No murmur heard. Pulmonary/Chest: Effort normal. No stridor. No respiratory distress. She has no wheezes. She has rales. She exhibits no tenderness.  Abdominal: Soft. She exhibits no distension. There is no tenderness.  Musculoskeletal: She exhibits no tenderness.  Neurological: She is alert and oriented to person, place, and time. No sensory deficit. She exhibits normal muscle tone.  Skin: Skin is warm. Capillary refill takes less than 2 seconds. No rash noted. She is not diaphoretic.  No erythema.  Nursing note and vitals reviewed.    ED Treatments / Results  Labs (all labs ordered are listed, but only abnormal results are displayed) Labs Reviewed  CBC WITH DIFFERENTIAL/PLATELET - Abnormal; Notable for the following components:      Result Value   Hemoglobin 11.7 (*)    MCH 24.6 (*)    MCHC 29.6 (*)    RDW 23.7 (*)    All other components within normal limits  COMPREHENSIVE METABOLIC PANEL - Abnormal; Notable for the following components:   Chloride 95 (*)    Glucose, Bld 181 (*)    Albumin 3.3 (*)    ALT 12 (*)    All other components within normal limits  URINALYSIS, ROUTINE W REFLEX MICROSCOPIC - Abnormal; Notable for the following components:   Color, Urine AMBER (*)    Ketones, ur 15 (*)    All other components within normal limits  I-STAT CG4 LACTIC ACID, ED - Abnormal; Notable for the following components:   Lactic Acid, Venous 3.67 (*)    All other components within normal limits  I-STAT CG4 LACTIC ACID, ED - Abnormal; Notable for the following components:   Lactic Acid, Venous 3.79 (*)    All other components within normal limits  I-STAT  CG4 LACTIC ACID, ED - Abnormal; Notable for the following components:   Lactic Acid, Venous 3.05 (*)    All other components within normal limits  URINE CULTURE  BRAIN NATRIURETIC PEPTIDE  TROPONIN I    EKG  EKG Interpretation  Date/Time:  Thursday October 31 2017 14:45:59 EST Ventricular Rate:  92 PR Interval:    QRS Duration: 86 QT Interval:  328 QTC Calculation: 406 R Axis:   -24 Text Interpretation:  Sinus rhythm LVH by voltage Nonspecific T abnormalities, lateral leads When compared to prior, no significant changes seen.  No STEMI Confirmed by Theda Belfast ([id number]) on 10/31/2017 4:05:29 PM       Radiology Dg Chest 2 View  Result Date: 10/31/2017 CLINICAL DATA:  History of pneumonia in December with only partial recovery. Patient has shortness of breath and exertional hypoxia. EXAM: CHEST  2 VIEW COMPARISON:  Chest x-ray of October 23, 2017 FINDINGS: The lungs are mildly hyperinflated. There is no focal infiltrate. There is no pleural effusion. The heart and pulmonary vascularity are normal. There calcification in the wall of the aortic arch. The bony thorax is unremarkable. IMPRESSION: COPD. There is no pneumonia nor other acute cardiopulmonary abnormality. Thoracic aortic atherosclerosis. Electronically Signed   By: David  [place] M.D.   On: 10/31/2017 15:22    Procedures Procedures (including critical care time)  Medications Ordered in ED Medications  sodium chloride 0.9 % bolus 1,000 mL (0 mLs Intravenous Stopped 10/31/17 2042)  ipratropium-albuterol (DUONEB) 0.5-2.5 (3) MG/3ML nebulizer solution 3 mL (3 mLs Nebulization Given 10/31/17 1949)     Initial Impression / Assessment and Plan / ED Course  I have reviewed the triage vital signs and the nursing notes.  Pertinent labs & imaging results that were available during my care of the patient were reviewed by me and considered in my medical decision making (see chart for details).     Charlie Char Yawn is a 84 y.o.  female with a past medical history significant for CAD with PCI, diabetes, COPD on 3 L home oxygen, hypertension, hyperlipidemia, and recent discharge from the hospital several days ago for symptomatic anemia and discovery of urinary tract infection currently still on antibiotics who presents with  worsening fatigue and shortness of breath with hypoxia.  Patient is coming by her daughter reports that patient did well after discharge after she received transfusions of blood for her anemia.  Daughter reports that over the last 2 days, patient has had worsening fatigue and whenever she tries to sit up or ambulate her oxygen dropped into the low 80s despite being on her home 3 L oxygen requirement.  The patient was scheduled to follow-up with her PCP today after her recent admission however when they called to inform of the new shortness of breath and hypoxia, she was told to come to the ED.  Patient denies worsened fevers, chills, chest pain, palpitations, nausea, vomiting, constipation, diarrhea.  She denies any urinary symptoms.  She denies having any urinary symptoms previously when she was diagnosed with UTI.  She does report that she has had some left upper back and flank pain that has been new for the last 2 days.  She describes that as moderate and aching.  She reports that she has not had much of an appetite and has not been eating or drinking as much since discharge.  On exam, lungs had crackles in the bases left greater than right.  Chest and abdomen were nontender.  Left CVA area was tender to palpation radiating around the left side of back.  No rash was seen in this location.  Patient had no evidence of edema in her legs.  Patient has symmetric pulses in upper and lower extremities.  Based on patient's recent discharge and symptoms I am concerned about patient developing pyelonephritis with the pain going to her left CVA area.  Also considering fluid overload, pneumonia, and other causes of worsening  shortness of breath.  Patient will have laboratory testing collected in order to evaluate.  Patient was placed on 3 L nasal cannula at this facility and her oxygen has improved however patient is remaining at rest.  Anticipate reassessment after workup.     Patient had diagnostic workup with results seen above.  Lactic acid was elevated however, after fluids he began to improve.  Urinalysis showed ketones, likely due to dehydration given the reported decreased oral intake over the last several days.  No evidence of worsening UTI.  Patient will continue antibiotics for UTI.  CBC and CMP reassuring.  BNP not elevated.  Patient given fluids.  Chest x-ray shows no pneumonia.  Given patient's symptoms, suspect dehydration.  Patient did not have evidence of worsened anemia after her recent admission.  Patient began feeling better after fluids.  Suspect dehydration led to patient's fatigue.  Patient was able to ambulate with her home oxygen to a reasonable extent before she began getting hypoxic.  Patient and family felt comfortable with her being discharged with close PCP follow-up and strict return precautions.  At this time, I do not feel patient has pneumonia, or any other abnormalities requiring admission.  Patient and family understand plan of care and patient was discharged in good condition with improving symptoms of fatigue and resolution of shortness of breath.   Final Clinical Impressions(s) / ED Diagnoses   Final diagnoses:  Shortness of breath  Fatigue, unspecified type    ED Discharge Orders    None      Clinical Impression: 1. Shortness of breath   2. Fatigue, unspecified type     Disposition: Discharge  Condition: Good  I have discussed the results, Dx and Tx plan with the pt(& family if present). He/she/they expressed understanding and agree(s)  with the plan. Discharge instructions discussed at great length. Strict return precautions discussed and pt &/or family have  verbalized understanding of the instructions. No further questions at time of discharge.    Discharge Medication List as of 10/31/2017  9:33 PM      Follow Up: Alysia Penna, MD [address] [place] [address] [phone]     [facility] [address] [id number] YQ [facility] [place] [address] [phone]       Tegeler, Canary Brim, MD 11/01/17 [phone]

## 2017-10-31 NOTE — ED Notes (Signed)
Patient transported to X-ray 

## 2017-11-01 LAB — URINE CULTURE: Culture: NO GROWTH

## 2017-11-04 DIAGNOSIS — Z9981 Dependence on supplemental oxygen: Secondary | ICD-10-CM | POA: Diagnosis not present

## 2017-11-04 DIAGNOSIS — D508 Other iron deficiency anemias: Secondary | ICD-10-CM | POA: Diagnosis not present

## 2017-11-04 DIAGNOSIS — R1319 Other dysphagia: Secondary | ICD-10-CM | POA: Diagnosis not present

## 2017-11-04 DIAGNOSIS — J9621 Acute and chronic respiratory failure with hypoxia: Secondary | ICD-10-CM | POA: Diagnosis not present

## 2017-11-04 DIAGNOSIS — D692 Other nonthrombocytopenic purpura: Secondary | ICD-10-CM | POA: Diagnosis not present

## 2017-11-04 DIAGNOSIS — I25118 Atherosclerotic heart disease of native coronary artery with other forms of angina pectoris: Secondary | ICD-10-CM | POA: Diagnosis not present

## 2017-11-04 DIAGNOSIS — Z0000 Encounter for general adult medical examination without abnormal findings: Secondary | ICD-10-CM | POA: Diagnosis not present

## 2017-11-04 DIAGNOSIS — E7849 Other hyperlipidemia: Secondary | ICD-10-CM | POA: Diagnosis not present

## 2017-11-04 DIAGNOSIS — R634 Abnormal weight loss: Secondary | ICD-10-CM | POA: Diagnosis not present

## 2017-11-04 DIAGNOSIS — E1151 Type 2 diabetes mellitus with diabetic peripheral angiopathy without gangrene: Secondary | ICD-10-CM | POA: Diagnosis not present

## 2017-11-04 DIAGNOSIS — J449 Chronic obstructive pulmonary disease, unspecified: Secondary | ICD-10-CM | POA: Diagnosis not present

## 2017-11-06 DIAGNOSIS — J449 Chronic obstructive pulmonary disease, unspecified: Secondary | ICD-10-CM | POA: Diagnosis not present

## 2017-11-06 DIAGNOSIS — I10 Essential (primary) hypertension: Secondary | ICD-10-CM | POA: Diagnosis not present

## 2017-11-06 DIAGNOSIS — D509 Iron deficiency anemia, unspecified: Secondary | ICD-10-CM | POA: Diagnosis not present

## 2017-11-06 DIAGNOSIS — I2510 Atherosclerotic heart disease of native coronary artery without angina pectoris: Secondary | ICD-10-CM | POA: Diagnosis not present

## 2017-11-06 DIAGNOSIS — E785 Hyperlipidemia, unspecified: Secondary | ICD-10-CM | POA: Diagnosis not present

## 2017-11-06 DIAGNOSIS — F419 Anxiety disorder, unspecified: Secondary | ICD-10-CM | POA: Diagnosis not present

## 2017-11-06 DIAGNOSIS — R413 Other amnesia: Secondary | ICD-10-CM | POA: Diagnosis not present

## 2017-11-06 DIAGNOSIS — I252 Old myocardial infarction: Secondary | ICD-10-CM | POA: Diagnosis not present

## 2017-11-06 DIAGNOSIS — E119 Type 2 diabetes mellitus without complications: Secondary | ICD-10-CM | POA: Diagnosis not present

## 2017-11-08 ENCOUNTER — Other Ambulatory Visit (HOSPITAL_COMMUNITY): Payer: Self-pay | Admitting: Internal Medicine

## 2017-11-08 DIAGNOSIS — Z1212 Encounter for screening for malignant neoplasm of rectum: Secondary | ICD-10-CM | POA: Diagnosis not present

## 2017-11-08 DIAGNOSIS — R1310 Dysphagia, unspecified: Secondary | ICD-10-CM

## 2017-11-11 ENCOUNTER — Ambulatory Visit: Payer: Medicare HMO | Admitting: Adult Health

## 2017-11-12 ENCOUNTER — Ambulatory Visit (HOSPITAL_COMMUNITY)
Admission: RE | Admit: 2017-11-12 | Discharge: 2017-11-12 | Disposition: A | Discharge status: Home / Self Care | Payer: Medicare HMO | Source: Ambulatory Visit | Attending: Internal Medicine | Admitting: Internal Medicine

## 2017-11-12 DIAGNOSIS — R1312 Dysphagia, oropharyngeal phase: Secondary | ICD-10-CM | POA: Insufficient documentation

## 2017-11-12 DIAGNOSIS — R1310 Dysphagia, unspecified: Secondary | ICD-10-CM

## 2017-11-12 DIAGNOSIS — T17320A Food in larynx causing asphyxiation, initial encounter: Secondary | ICD-10-CM | POA: Diagnosis not present

## 2017-11-13 ENCOUNTER — Encounter: Payer: Self-pay | Admitting: Internal Medicine

## 2017-11-13 ENCOUNTER — Ambulatory Visit: Payer: Medicare HMO | Admitting: Internal Medicine

## 2017-11-13 VITALS — BP 106/56 | HR 102 | Ht 60.0 in | Wt 98.0 lb

## 2017-11-13 DIAGNOSIS — J449 Chronic obstructive pulmonary disease, unspecified: Secondary | ICD-10-CM | POA: Diagnosis not present

## 2017-11-13 DIAGNOSIS — J9611 Chronic respiratory failure with hypoxia: Secondary | ICD-10-CM | POA: Diagnosis not present

## 2017-11-13 MED ORDER — PANTOPRAZOLE SODIUM 40 MG PO TBEC: 40.0000 mg | DELAYED_RELEASE_TABLET | Freq: Every day | ORAL | 2 refills | Status: DC

## 2017-11-13 MED ORDER — PREDNISONE 10 MG PO TABS: ORAL_TABLET | ORAL | 0 refills | Status: AC

## 2017-11-13 NOTE — Patient Instructions (Addendum)
Adjust 02 flow with exertion to keep it above 90%  Increase the duoneb to four times daily   Prednisone 10 mg take  4 each am x 2 days,   2 each am x 2 days,  1 each am x 2 days and stop   For cough > mucinex 600 mg up to 1200 mg every 12 hours   Add protonix 40 mg Take 30-60 min before first meal of the day   GERD (REFLUX)  is an extremely common cause of respiratory symptoms just like yours , many times with no obvious heartburn at all.    It can be treated with medication, but also with lifestyle changes including elevation of the head of your bed (ideally with 6 inch  bed blocks),  Smoking cessation, avoidance of late meals, excessive alcohol, and avoid fatty foods, chocolate, peppermint, colas, red wine, and acidic juices such as orange juice.  NO MINT OR MENTHOL PRODUCTS SO NO COUGH DROPS   USE SUGARLESS CANDY INSTEAD (Jolley ranchers or Stover's or Life Savers) or even ice chips will also do - the key is to swallow to prevent all throat clearing. NO OIL BASED VITAMINS - use powdered substitutes.   If you are satisfied with your treatment plan,  let your doctor know and he/she can either refill your medications or you can return here when your prescription runs out.     If in any way you are not 100% satisfied,  please tell us.  If 100% better, tell your friends!  Pulmonary follow up is as needed

## 2017-11-13 NOTE — Progress Notes (Signed)
Subjective:    Patient ID: Karen Hunter, female    DOB: 08/25/1931    MRN: [id number]      Brief patient profile:  Jamison Neighborestor pt last seen 07/02/17 with following a/p 1. COPD with centrilobular emphysema: Continuing patient on duonebs 3 times a day & Pulmicort nebulized twice daily. Awaiting records from her previous pulmonologist office. 2. Chronic hypoxic respiratory failure: Sending in new prescription to her local DME company for a portable and stationary concentrator. Continuing oxygen at 3 L/m pulse with exertion.  3. CODE STATUS: Previously endorsed a full DO NOT RESUSCITATE/DO NOT INTUBATE.     11/13/2017  Extended f/u ov/Kilyn Maragh re: transition of care copd/ resp failure/ anemia  Chief Complaint  Patient presents with  . Follow-up    Former patient of Dr Jamison NeighborNestor. Pt recently hospitalized for anemia. She states she has been more DOE since Nov 2018 and she has not appetite. She is winded just walking from room to room at home.  She is also coughing more, esp with she eats or drinks anything. She is coughing up some yellow sputum recently. She has an albuterol inhaler that she rarely uses.   struggling to swallow   cream of wheat / takes pills in applesauce / off fosfamax one week prior to OV    Dyspnea:  Walk room to room on 02 on 3lpm / recently got walker  Cough: worse with eating and hs > variably yellow and occ food comes up  Sleep: having to sit up due to cough about 30 degrees duonen tid and bud but doesn't seem to help much / prednisone has helped maybe a little in past  Palliative care consult due 11/15/17   No obvious other patterns in  day to day or daytime variability or assoc   mucus plugs or hemoptysis or cp or chest tightness, subjective wheeze or overt sinus or hb symptoms. No unusual exposure hx or h/o childhood pna/ asthma or knowledge of premature birth.    Also denies any obvious fluctuation of symptoms with weather or environmental changes or other aggravating or  alleviating factors except as outlined above   Current Allergies, Complete Past Medical History, Past Surgical History, Family History, and Social History were reviewed in [facility] electronic medical record.  ROS  The following are not active complaints unless bolded Hoarseness, sore throat, dysphagia, dental problems, itching, sneezing,  nasal congestion or discharge of excess mucus or purulent secretions, ear ache,   fever, chills, sweats, unintended wt loss or wt gain, classically pleuritic or exertional cp,  orthopnea pnd or leg swelling, presyncope, palpitations, abdominal pain, anorexia, nausea, vomiting, diarrhea  or change in bowel habits or change in bladder habits, change in stools or change in urine, dysuria, hematuria,  rash, arthralgias, visual complaints, headache, numbness, weakness or ataxia or problems with walking or coordination,  change in mood/affect or memory.        Current Meds  Medication Sig  . aspirin 81 MG tablet Take 81 mg by mouth daily.  . budesonide (PULMICORT) 0.5 MG/2ML nebulizer solution Take 0.5 mg by nebulization 2 (two) times daily.   . diclofenac sodium (VOLTAREN) 1 % GEL Apply 2 g topically 4 (four) times daily.  Marland Kitchen. donepezil (ARICEPT) 5 MG tablet Take 5 mg by mouth daily.   Marland Kitchen. escitalopram (LEXAPRO) 10 MG tablet Take 10 mg by mouth daily.  . famotidine (PEPCID) 20 MG tablet Take 20 mg by mouth at bedtime.  . Ferrous Sulfate 220 (44  Fe) MG/5ML LIQD Take 5 mLs by mouth 2 (two) times daily.  Marland Kitchen guaiFENesin (MUCINEX) 600 MG 12 hr tablet Take 600 mg by mouth as needed.  Marland Kitchen ipratropium-albuterol (DUONEB) 0.5-2.5 (3) MG/3ML SOLN Inhale 3 mLs into the lungs 3 (three) times daily.   . metFORMIN (GLUCOPHAGE) 500 MG tablet Take 500 mg by mouth daily with breakfast.   . Multiple Vitamins-Minerals (MULTIVITAMIN WITH IRON-MINERALS) liquid Take 5 mLs by mouth daily.  . Nutritional Supplements (GLUCERNA ADVANCE SHAKE) LIQD Take 1 Can by mouth 2 (two) times daily  between meals.  . OXYGEN 3lpm 24/7 [employer]otech           Objective:   Physical Exam     W/c bound pale wf nad   Wt Readings from Last 3 Encounters:  11/13/17 98 lb (44.5 kg)  10/31/17 102 lb (46.3 kg)  10/23/17 102 lb 8 oz (46.5 kg)     Vital signs reviewed - Note on arrival 02 sats  92 % on 3lpm pulsed  HEENT: nl dentition, turbinates bilaterally, and oropharynx. Nl external ear canals without cough reflex   NECK :  without JVD/Nodes/TM/ nl carotid upstrokes bilaterally   LUNGS: no acc muscle use,  Mod kyphosis/ distant bs bilaterally with minimal exp rhonchi   CV:  RRR  no s3 or murmur or increase in P2, and no edema   ABD:  soft and nontender  . No bruits or organomegaly appreciated, bowel sounds nl  MS:    ext warm without deformities, calf tenderness, cyanosis or clubbing No obvious joint restrictions   SKIN: warm and dry without lesions    NEURO:  Alert,  But very little spontaneous speech/     no motor or cerebellar deficits apparent.     Labs  reviewed:      Chemistry      Component Value Date/Time   NA 136 10/31/2017 1452   K 4.1 10/31/2017 1452   CL 95 (L) 10/31/2017 1452   CO2 30 10/31/2017 1452   BUN 14 10/31/2017 1452   CREATININE 0.77 10/31/2017 1452      Component Value Date/Time   CALCIUM 9.6 10/31/2017 1452   ALKPHOS 59 10/31/2017 1452   AST 27 10/31/2017 1452   ALT 12 (L) 10/31/2017 1452   BILITOT 0.5 10/31/2017 1452        Lab Results  Component Value Date   WBC 5.6 10/31/2017   HGB 11.7 (L) 10/31/2017   HCT 39.5 10/31/2017   MCV 83.0 10/31/2017   PLT 243 10/31/2017        EOS                       0.2                                                                   10/31/17       Lab Results  Component Value Date   TSH 2.850   02/15/2009         BNP                         55  10/31/17          Studies reviewed 11/13/2017   IMAGING CT CHEST W/O  11/07/16   Apical predominant centrilobular emphysema. Linear opacification with air bronchograms within the lingula and a small amount within right middle lobe indicative of previous scar tissue formation. No developing mass appreciated. No pleural effusion or thickening. No pathologic mediastinal adenopathy. No pericardial effusion.     CARDIAC TTE (11/09/16): LV normal in size with EF 60-65%, normal regional wall motion, & normal diastolic function. LA & RA normal in size. RV normal in size and function. No aortic stenosis or regurgitation. Aortic root normal in size. Mild mitral regurgitation without stenosis. No significant pulmonic regurgitation. Trivial tricuspid regurgitation. No pericardial effusion.      Assessment & Plan:

## 2017-11-14 ENCOUNTER — Encounter: Payer: Self-pay | Admitting: Internal Medicine

## 2017-11-14 DIAGNOSIS — D509 Iron deficiency anemia, unspecified: Secondary | ICD-10-CM | POA: Diagnosis not present

## 2017-11-14 DIAGNOSIS — E785 Hyperlipidemia, unspecified: Secondary | ICD-10-CM | POA: Diagnosis not present

## 2017-11-14 DIAGNOSIS — J449 Chronic obstructive pulmonary disease, unspecified: Secondary | ICD-10-CM | POA: Diagnosis not present

## 2017-11-14 DIAGNOSIS — R413 Other amnesia: Secondary | ICD-10-CM | POA: Diagnosis not present

## 2017-11-14 DIAGNOSIS — I2510 Atherosclerotic heart disease of native coronary artery without angina pectoris: Secondary | ICD-10-CM | POA: Diagnosis not present

## 2017-11-14 DIAGNOSIS — I10 Essential (primary) hypertension: Secondary | ICD-10-CM | POA: Diagnosis not present

## 2017-11-14 DIAGNOSIS — F419 Anxiety disorder, unspecified: Secondary | ICD-10-CM | POA: Diagnosis not present

## 2017-11-14 DIAGNOSIS — E119 Type 2 diabetes mellitus without complications: Secondary | ICD-10-CM | POA: Diagnosis not present

## 2017-11-14 DIAGNOSIS — I252 Old myocardial infarction: Secondary | ICD-10-CM | POA: Diagnosis not present

## 2017-11-14 NOTE — Assessment & Plan Note (Signed)
Adequate control on present rx, reviewed in detail with pt > no change in rx needed  > ok to titrate with goal of keeping sats > 90% / reviewed

## 2017-11-14 NOTE — Assessment & Plan Note (Addendum)
DDX of  difficult airways management almost all start with A and  include Adherence, Ace Inhibitors, Acid Reflux, Active Sinus Disease, Alpha 1 Antitripsin deficiency, Anxiety masquerading as Airways dz,  ABPA,  Allergy(esp in young), Aspiration (esp in elderly), Adverse effects of meds,  Active smokers, A bunch of PE's (a small clot burden can't cause this syndrome unless there is already severe underlying pulm or vascular dz with poor reserve) plus two Bs  = Bronchiectasis and Beta blocker use..and one C= CHF   Adherence is always the initial "prime suspect" and is a multilayered concern that requires a "trust but verify" approach in every patient - starting with knowing how to use medications, especially inhalers, correctly, keeping up with refills and understanding the fundamental difference between maintenance and prns vs those medications only taken for a very short course and then stopped and not refilled.  - reviewed optimal timing for duoneb/ pulmocort   ? Acid (or non-acid) GERD > always difficult to exclude as up to 75% of pts in some series report no assoc GI/ Heartburn symptoms and she had been until recently on fosfamax > rec stay off fosfamax permanently and  max (24h)  acid suppression and diet restrictions/ reviewed and instructions given in writing.   Aspiration clearly a big concern here and may be due to both orophanygeal (neurologic) and gerd related (? Stricture) / reviewed   ? Allergy > pred has helped some in past > continue pulmocort and added Prednisone 10 mg take  4 each am x 2 days,   2 each am x 2 days,  1 each am x 2 days and stop   ? Adverse effects of meds > other than fosfamax which has already been stopped, none seen  ? Anxiety/depression / cognitive dysfunction > usually at the bottom of this list of usual suspects but should be much higher on this pt's based on H and P and note already on psychotropics .   Anemia does start with a A and certainly can affect gas  exchange in pts with poor v/q or shunt but has been corrected  ? Beta blocker > n/a   ? Bronchiectasis > not seen on recent CT   ? CHF > excluded by recent    I had an extended discussion with the patient and daughter reviewing all relevant studies completed to date and  lasting 25 minutes of a 40  Minute  transition of care office visit with pt new to me     re  severe non-specific but potentially very serious refractory respiratory symptoms of uncertain and potentially multiple  etiologies.   Each maintenance medication was reviewed in detail including most importantly the difference between maintenance and prns and under what circumstances the prns are to be triggered using an action plan format that is not reflected in the computer generated alphabetically organized AVS.    Please see AVS for specific instructions unique to this office visit that I personally wrote and verbalized to the the pt in detail and then reviewed with pt  by my nurse highlighting any changes in therapy/plan of care  recommended at today's visit.

## 2017-11-15 DIAGNOSIS — R531 Weakness: Secondary | ICD-10-CM | POA: Diagnosis not present

## 2017-11-20 DIAGNOSIS — I2510 Atherosclerotic heart disease of native coronary artery without angina pectoris: Secondary | ICD-10-CM | POA: Diagnosis not present

## 2017-11-20 DIAGNOSIS — E119 Type 2 diabetes mellitus without complications: Secondary | ICD-10-CM | POA: Diagnosis not present

## 2017-11-20 DIAGNOSIS — J449 Chronic obstructive pulmonary disease, unspecified: Secondary | ICD-10-CM | POA: Diagnosis not present

## 2017-11-20 DIAGNOSIS — I10 Essential (primary) hypertension: Secondary | ICD-10-CM | POA: Diagnosis not present

## 2017-11-20 DIAGNOSIS — E785 Hyperlipidemia, unspecified: Secondary | ICD-10-CM | POA: Diagnosis not present

## 2017-11-20 DIAGNOSIS — R413 Other amnesia: Secondary | ICD-10-CM | POA: Diagnosis not present

## 2017-11-20 DIAGNOSIS — F419 Anxiety disorder, unspecified: Secondary | ICD-10-CM | POA: Diagnosis not present

## 2017-11-20 DIAGNOSIS — D509 Iron deficiency anemia, unspecified: Secondary | ICD-10-CM | POA: Diagnosis not present

## 2017-11-20 DIAGNOSIS — I252 Old myocardial infarction: Secondary | ICD-10-CM | POA: Diagnosis not present

## 2017-11-22 ENCOUNTER — Telehealth: Payer: Self-pay | Admitting: Internal Medicine

## 2017-11-22 MED ORDER — PREDNISONE 10 MG PO TABS: ORAL_TABLET | ORAL | 0 refills | Status: AC

## 2017-11-22 NOTE — Telephone Encounter (Signed)
Spoke with pt's daughter, she states she seemed to be doing well but after she weaned down she started having trouble breathing again. She is wheezing and very fatigued. She wanted to let MW know before the weekend to see if he wanted to Rx her more Prednisone. They are in the process of moving and then they are going to assess if hospice can get involved. MW please advise.   [facility].    Adjust 02 flow with exertion to keep it above 90%  Increase the duoneb to four times daily   Prednisone 10 mg take  4 each am x 2 days,   2 each am x 2 days,  1 each am x 2 days and stop   For cough > mucinex 600 mg up to 1200 mg every 12 hours   Add protonix 40 mg Take 30-60 min before first meal of the day   GERD (REFLUX)  is an extremely common cause of respiratory symptoms just like yours , many times with no obvious heartburn at all.

## 2017-11-22 NOTE — Telephone Encounter (Signed)
Prednisone 10 mg take  4 each am x 2 days,   2 each am x 2 days,  1 each am x 2 days and stop  

## 2017-11-22 NOTE — Telephone Encounter (Signed)
Spoke with pt Karen Hunter, advised Rx sent to the pharmacy. Nothing further is needed.

## 2017-11-27 DIAGNOSIS — I2510 Atherosclerotic heart disease of native coronary artery without angina pectoris: Secondary | ICD-10-CM | POA: Diagnosis not present

## 2017-11-27 DIAGNOSIS — R413 Other amnesia: Secondary | ICD-10-CM | POA: Diagnosis not present

## 2017-11-27 DIAGNOSIS — F419 Anxiety disorder, unspecified: Secondary | ICD-10-CM | POA: Diagnosis not present

## 2017-11-27 DIAGNOSIS — J449 Chronic obstructive pulmonary disease, unspecified: Secondary | ICD-10-CM | POA: Diagnosis not present

## 2017-11-27 DIAGNOSIS — E119 Type 2 diabetes mellitus without complications: Secondary | ICD-10-CM | POA: Diagnosis not present

## 2017-11-27 DIAGNOSIS — I252 Old myocardial infarction: Secondary | ICD-10-CM | POA: Diagnosis not present

## 2017-11-27 DIAGNOSIS — I10 Essential (primary) hypertension: Secondary | ICD-10-CM | POA: Diagnosis not present

## 2017-11-27 DIAGNOSIS — D509 Iron deficiency anemia, unspecified: Secondary | ICD-10-CM | POA: Diagnosis not present

## 2017-11-27 DIAGNOSIS — E785 Hyperlipidemia, unspecified: Secondary | ICD-10-CM | POA: Diagnosis not present

## 2017-11-30 DIAGNOSIS — J449 Chronic obstructive pulmonary disease, unspecified: Secondary | ICD-10-CM | POA: Diagnosis not present

## 2017-12-20 DIAGNOSIS — R1310 Dysphagia, unspecified: Secondary | ICD-10-CM | POA: Diagnosis not present

## 2017-12-20 DIAGNOSIS — F339 Major depressive disorder, recurrent, unspecified: Secondary | ICD-10-CM | POA: Diagnosis not present

## 2017-12-20 DIAGNOSIS — E46 Unspecified protein-calorie malnutrition: Secondary | ICD-10-CM | POA: Diagnosis not present

## 2017-12-20 DIAGNOSIS — F0280 Dementia in other diseases classified elsewhere without behavioral disturbance: Secondary | ICD-10-CM | POA: Diagnosis not present

## 2017-12-20 DIAGNOSIS — D638 Anemia in other chronic diseases classified elsewhere: Secondary | ICD-10-CM | POA: Diagnosis not present

## 2017-12-20 DIAGNOSIS — K219 Gastro-esophageal reflux disease without esophagitis: Secondary | ICD-10-CM | POA: Diagnosis not present

## 2017-12-20 DIAGNOSIS — I25119 Atherosclerotic heart disease of native coronary artery with unspecified angina pectoris: Secondary | ICD-10-CM | POA: Diagnosis not present

## 2017-12-20 DIAGNOSIS — J449 Chronic obstructive pulmonary disease, unspecified: Secondary | ICD-10-CM | POA: Diagnosis not present

## 2017-12-20 DIAGNOSIS — J9620 Acute and chronic respiratory failure, unspecified whether with hypoxia or hypercapnia: Secondary | ICD-10-CM | POA: Diagnosis not present

## 2017-12-20 DIAGNOSIS — E1159 Type 2 diabetes mellitus with other circulatory complications: Secondary | ICD-10-CM | POA: Diagnosis not present

## 2017-12-21 DIAGNOSIS — J449 Chronic obstructive pulmonary disease, unspecified: Secondary | ICD-10-CM | POA: Diagnosis not present

## 2017-12-21 DIAGNOSIS — J9620 Acute and chronic respiratory failure, unspecified whether with hypoxia or hypercapnia: Secondary | ICD-10-CM | POA: Diagnosis not present

## 2017-12-21 DIAGNOSIS — D638 Anemia in other chronic diseases classified elsewhere: Secondary | ICD-10-CM | POA: Diagnosis not present

## 2017-12-21 DIAGNOSIS — E46 Unspecified protein-calorie malnutrition: Secondary | ICD-10-CM | POA: Diagnosis not present

## 2017-12-21 DIAGNOSIS — I25119 Atherosclerotic heart disease of native coronary artery with unspecified angina pectoris: Secondary | ICD-10-CM | POA: Diagnosis not present

## 2017-12-21 DIAGNOSIS — E1159 Type 2 diabetes mellitus with other circulatory complications: Secondary | ICD-10-CM | POA: Diagnosis not present

## 2017-12-23 DIAGNOSIS — E1159 Type 2 diabetes mellitus with other circulatory complications: Secondary | ICD-10-CM | POA: Diagnosis not present

## 2017-12-23 DIAGNOSIS — E46 Unspecified protein-calorie malnutrition: Secondary | ICD-10-CM | POA: Diagnosis not present

## 2017-12-23 DIAGNOSIS — J449 Chronic obstructive pulmonary disease, unspecified: Secondary | ICD-10-CM | POA: Diagnosis not present

## 2017-12-23 DIAGNOSIS — J9620 Acute and chronic respiratory failure, unspecified whether with hypoxia or hypercapnia: Secondary | ICD-10-CM | POA: Diagnosis not present

## 2017-12-23 DIAGNOSIS — D638 Anemia in other chronic diseases classified elsewhere: Secondary | ICD-10-CM | POA: Diagnosis not present

## 2017-12-23 DIAGNOSIS — I25119 Atherosclerotic heart disease of native coronary artery with unspecified angina pectoris: Secondary | ICD-10-CM | POA: Diagnosis not present

## 2017-12-26 DIAGNOSIS — I25119 Atherosclerotic heart disease of native coronary artery with unspecified angina pectoris: Secondary | ICD-10-CM | POA: Diagnosis not present

## 2017-12-26 DIAGNOSIS — D638 Anemia in other chronic diseases classified elsewhere: Secondary | ICD-10-CM | POA: Diagnosis not present

## 2017-12-26 DIAGNOSIS — J449 Chronic obstructive pulmonary disease, unspecified: Secondary | ICD-10-CM | POA: Diagnosis not present

## 2017-12-26 DIAGNOSIS — J9620 Acute and chronic respiratory failure, unspecified whether with hypoxia or hypercapnia: Secondary | ICD-10-CM | POA: Diagnosis not present

## 2017-12-26 DIAGNOSIS — E1159 Type 2 diabetes mellitus with other circulatory complications: Secondary | ICD-10-CM | POA: Diagnosis not present

## 2017-12-26 DIAGNOSIS — E46 Unspecified protein-calorie malnutrition: Secondary | ICD-10-CM | POA: Diagnosis not present

## 2017-12-28 DIAGNOSIS — J449 Chronic obstructive pulmonary disease, unspecified: Secondary | ICD-10-CM | POA: Diagnosis not present

## 2017-12-31 DIAGNOSIS — J9620 Acute and chronic respiratory failure, unspecified whether with hypoxia or hypercapnia: Secondary | ICD-10-CM | POA: Diagnosis not present

## 2017-12-31 DIAGNOSIS — I25119 Atherosclerotic heart disease of native coronary artery with unspecified angina pectoris: Secondary | ICD-10-CM | POA: Diagnosis not present

## 2017-12-31 DIAGNOSIS — E46 Unspecified protein-calorie malnutrition: Secondary | ICD-10-CM | POA: Diagnosis not present

## 2017-12-31 DIAGNOSIS — J449 Chronic obstructive pulmonary disease, unspecified: Secondary | ICD-10-CM | POA: Diagnosis not present

## 2017-12-31 DIAGNOSIS — D638 Anemia in other chronic diseases classified elsewhere: Secondary | ICD-10-CM | POA: Diagnosis not present

## 2017-12-31 DIAGNOSIS — E1159 Type 2 diabetes mellitus with other circulatory complications: Secondary | ICD-10-CM | POA: Diagnosis not present

## 2018-01-02 DIAGNOSIS — E1159 Type 2 diabetes mellitus with other circulatory complications: Secondary | ICD-10-CM | POA: Diagnosis not present

## 2018-01-02 DIAGNOSIS — E46 Unspecified protein-calorie malnutrition: Secondary | ICD-10-CM | POA: Diagnosis not present

## 2018-01-02 DIAGNOSIS — I25119 Atherosclerotic heart disease of native coronary artery with unspecified angina pectoris: Secondary | ICD-10-CM | POA: Diagnosis not present

## 2018-01-02 DIAGNOSIS — D638 Anemia in other chronic diseases classified elsewhere: Secondary | ICD-10-CM | POA: Diagnosis not present

## 2018-01-02 DIAGNOSIS — J449 Chronic obstructive pulmonary disease, unspecified: Secondary | ICD-10-CM | POA: Diagnosis not present

## 2018-01-02 DIAGNOSIS — J9620 Acute and chronic respiratory failure, unspecified whether with hypoxia or hypercapnia: Secondary | ICD-10-CM | POA: Diagnosis not present

## 2018-01-07 DIAGNOSIS — I25119 Atherosclerotic heart disease of native coronary artery with unspecified angina pectoris: Secondary | ICD-10-CM | POA: Diagnosis not present

## 2018-01-07 DIAGNOSIS — E1159 Type 2 diabetes mellitus with other circulatory complications: Secondary | ICD-10-CM | POA: Diagnosis not present

## 2018-01-07 DIAGNOSIS — D638 Anemia in other chronic diseases classified elsewhere: Secondary | ICD-10-CM | POA: Diagnosis not present

## 2018-01-07 DIAGNOSIS — E46 Unspecified protein-calorie malnutrition: Secondary | ICD-10-CM | POA: Diagnosis not present

## 2018-01-07 DIAGNOSIS — J9620 Acute and chronic respiratory failure, unspecified whether with hypoxia or hypercapnia: Secondary | ICD-10-CM | POA: Diagnosis not present

## 2018-01-07 DIAGNOSIS — J449 Chronic obstructive pulmonary disease, unspecified: Secondary | ICD-10-CM | POA: Diagnosis not present

## 2018-01-09 DIAGNOSIS — D638 Anemia in other chronic diseases classified elsewhere: Secondary | ICD-10-CM | POA: Diagnosis not present

## 2018-01-09 DIAGNOSIS — E46 Unspecified protein-calorie malnutrition: Secondary | ICD-10-CM | POA: Diagnosis not present

## 2018-01-09 DIAGNOSIS — J449 Chronic obstructive pulmonary disease, unspecified: Secondary | ICD-10-CM | POA: Diagnosis not present

## 2018-01-09 DIAGNOSIS — J9620 Acute and chronic respiratory failure, unspecified whether with hypoxia or hypercapnia: Secondary | ICD-10-CM | POA: Diagnosis not present

## 2018-01-09 DIAGNOSIS — E1159 Type 2 diabetes mellitus with other circulatory complications: Secondary | ICD-10-CM | POA: Diagnosis not present

## 2018-01-09 DIAGNOSIS — I25119 Atherosclerotic heart disease of native coronary artery with unspecified angina pectoris: Secondary | ICD-10-CM | POA: Diagnosis not present

## 2018-01-13 DIAGNOSIS — J9620 Acute and chronic respiratory failure, unspecified whether with hypoxia or hypercapnia: Secondary | ICD-10-CM | POA: Diagnosis not present

## 2018-01-13 DIAGNOSIS — D638 Anemia in other chronic diseases classified elsewhere: Secondary | ICD-10-CM | POA: Diagnosis not present

## 2018-01-13 DIAGNOSIS — E46 Unspecified protein-calorie malnutrition: Secondary | ICD-10-CM | POA: Diagnosis not present

## 2018-01-13 DIAGNOSIS — K219 Gastro-esophageal reflux disease without esophagitis: Secondary | ICD-10-CM | POA: Diagnosis not present

## 2018-01-13 DIAGNOSIS — R1310 Dysphagia, unspecified: Secondary | ICD-10-CM | POA: Diagnosis not present

## 2018-01-13 DIAGNOSIS — I25119 Atherosclerotic heart disease of native coronary artery with unspecified angina pectoris: Secondary | ICD-10-CM | POA: Diagnosis not present

## 2018-01-13 DIAGNOSIS — F339 Major depressive disorder, recurrent, unspecified: Secondary | ICD-10-CM | POA: Diagnosis not present

## 2018-01-13 DIAGNOSIS — J449 Chronic obstructive pulmonary disease, unspecified: Secondary | ICD-10-CM | POA: Diagnosis not present

## 2018-01-13 DIAGNOSIS — E1159 Type 2 diabetes mellitus with other circulatory complications: Secondary | ICD-10-CM | POA: Diagnosis not present

## 2018-01-13 DIAGNOSIS — F0280 Dementia in other diseases classified elsewhere without behavioral disturbance: Secondary | ICD-10-CM | POA: Diagnosis not present

## 2018-01-16 DIAGNOSIS — E1159 Type 2 diabetes mellitus with other circulatory complications: Secondary | ICD-10-CM | POA: Diagnosis not present

## 2018-01-16 DIAGNOSIS — D638 Anemia in other chronic diseases classified elsewhere: Secondary | ICD-10-CM | POA: Diagnosis not present

## 2018-01-16 DIAGNOSIS — I25119 Atherosclerotic heart disease of native coronary artery with unspecified angina pectoris: Secondary | ICD-10-CM | POA: Diagnosis not present

## 2018-01-16 DIAGNOSIS — E46 Unspecified protein-calorie malnutrition: Secondary | ICD-10-CM | POA: Diagnosis not present

## 2018-01-16 DIAGNOSIS — J9620 Acute and chronic respiratory failure, unspecified whether with hypoxia or hypercapnia: Secondary | ICD-10-CM | POA: Diagnosis not present

## 2018-01-16 DIAGNOSIS — J449 Chronic obstructive pulmonary disease, unspecified: Secondary | ICD-10-CM | POA: Diagnosis not present

## 2018-01-21 DIAGNOSIS — I25119 Atherosclerotic heart disease of native coronary artery with unspecified angina pectoris: Secondary | ICD-10-CM | POA: Diagnosis not present

## 2018-01-21 DIAGNOSIS — D638 Anemia in other chronic diseases classified elsewhere: Secondary | ICD-10-CM | POA: Diagnosis not present

## 2018-01-21 DIAGNOSIS — J449 Chronic obstructive pulmonary disease, unspecified: Secondary | ICD-10-CM | POA: Diagnosis not present

## 2018-01-21 DIAGNOSIS — E46 Unspecified protein-calorie malnutrition: Secondary | ICD-10-CM | POA: Diagnosis not present

## 2018-01-21 DIAGNOSIS — E1159 Type 2 diabetes mellitus with other circulatory complications: Secondary | ICD-10-CM | POA: Diagnosis not present

## 2018-01-21 DIAGNOSIS — J9620 Acute and chronic respiratory failure, unspecified whether with hypoxia or hypercapnia: Secondary | ICD-10-CM | POA: Diagnosis not present

## 2018-01-23 DIAGNOSIS — J9620 Acute and chronic respiratory failure, unspecified whether with hypoxia or hypercapnia: Secondary | ICD-10-CM | POA: Diagnosis not present

## 2018-01-23 DIAGNOSIS — E1159 Type 2 diabetes mellitus with other circulatory complications: Secondary | ICD-10-CM | POA: Diagnosis not present

## 2018-01-23 DIAGNOSIS — J449 Chronic obstructive pulmonary disease, unspecified: Secondary | ICD-10-CM | POA: Diagnosis not present

## 2018-01-23 DIAGNOSIS — D638 Anemia in other chronic diseases classified elsewhere: Secondary | ICD-10-CM | POA: Diagnosis not present

## 2018-01-23 DIAGNOSIS — E46 Unspecified protein-calorie malnutrition: Secondary | ICD-10-CM | POA: Diagnosis not present

## 2018-01-23 DIAGNOSIS — I25119 Atherosclerotic heart disease of native coronary artery with unspecified angina pectoris: Secondary | ICD-10-CM | POA: Diagnosis not present

## 2018-01-28 DIAGNOSIS — E1159 Type 2 diabetes mellitus with other circulatory complications: Secondary | ICD-10-CM | POA: Diagnosis not present

## 2018-01-28 DIAGNOSIS — I25119 Atherosclerotic heart disease of native coronary artery with unspecified angina pectoris: Secondary | ICD-10-CM | POA: Diagnosis not present

## 2018-01-28 DIAGNOSIS — E46 Unspecified protein-calorie malnutrition: Secondary | ICD-10-CM | POA: Diagnosis not present

## 2018-01-28 DIAGNOSIS — J9620 Acute and chronic respiratory failure, unspecified whether with hypoxia or hypercapnia: Secondary | ICD-10-CM | POA: Diagnosis not present

## 2018-01-28 DIAGNOSIS — D638 Anemia in other chronic diseases classified elsewhere: Secondary | ICD-10-CM | POA: Diagnosis not present

## 2018-01-28 DIAGNOSIS — J449 Chronic obstructive pulmonary disease, unspecified: Secondary | ICD-10-CM | POA: Diagnosis not present

## 2018-01-30 ENCOUNTER — Other Ambulatory Visit: Payer: Self-pay | Admitting: Internal Medicine

## 2018-02-04 DIAGNOSIS — D638 Anemia in other chronic diseases classified elsewhere: Secondary | ICD-10-CM | POA: Diagnosis not present

## 2018-02-04 DIAGNOSIS — J449 Chronic obstructive pulmonary disease, unspecified: Secondary | ICD-10-CM | POA: Diagnosis not present

## 2018-02-04 DIAGNOSIS — E46 Unspecified protein-calorie malnutrition: Secondary | ICD-10-CM | POA: Diagnosis not present

## 2018-02-04 DIAGNOSIS — J9620 Acute and chronic respiratory failure, unspecified whether with hypoxia or hypercapnia: Secondary | ICD-10-CM | POA: Diagnosis not present

## 2018-02-04 DIAGNOSIS — E1159 Type 2 diabetes mellitus with other circulatory complications: Secondary | ICD-10-CM | POA: Diagnosis not present

## 2018-02-04 DIAGNOSIS — I25119 Atherosclerotic heart disease of native coronary artery with unspecified angina pectoris: Secondary | ICD-10-CM | POA: Diagnosis not present

## 2018-02-06 DIAGNOSIS — J449 Chronic obstructive pulmonary disease, unspecified: Secondary | ICD-10-CM | POA: Diagnosis not present

## 2018-02-06 DIAGNOSIS — E1159 Type 2 diabetes mellitus with other circulatory complications: Secondary | ICD-10-CM | POA: Diagnosis not present

## 2018-02-06 DIAGNOSIS — I25119 Atherosclerotic heart disease of native coronary artery with unspecified angina pectoris: Secondary | ICD-10-CM | POA: Diagnosis not present

## 2018-02-06 DIAGNOSIS — E46 Unspecified protein-calorie malnutrition: Secondary | ICD-10-CM | POA: Diagnosis not present

## 2018-02-06 DIAGNOSIS — D638 Anemia in other chronic diseases classified elsewhere: Secondary | ICD-10-CM | POA: Diagnosis not present

## 2018-02-06 DIAGNOSIS — J9620 Acute and chronic respiratory failure, unspecified whether with hypoxia or hypercapnia: Secondary | ICD-10-CM | POA: Diagnosis not present

## 2018-02-08 ENCOUNTER — Other Ambulatory Visit: Payer: Self-pay | Admitting: Internal Medicine

## 2018-02-12 DIAGNOSIS — J9620 Acute and chronic respiratory failure, unspecified whether with hypoxia or hypercapnia: Secondary | ICD-10-CM | POA: Diagnosis not present

## 2018-02-12 DIAGNOSIS — F0280 Dementia in other diseases classified elsewhere without behavioral disturbance: Secondary | ICD-10-CM | POA: Diagnosis not present

## 2018-02-12 DIAGNOSIS — R1310 Dysphagia, unspecified: Secondary | ICD-10-CM | POA: Diagnosis not present

## 2018-02-12 DIAGNOSIS — E1159 Type 2 diabetes mellitus with other circulatory complications: Secondary | ICD-10-CM | POA: Diagnosis not present

## 2018-02-12 DIAGNOSIS — E46 Unspecified protein-calorie malnutrition: Secondary | ICD-10-CM | POA: Diagnosis not present

## 2018-02-12 DIAGNOSIS — F339 Major depressive disorder, recurrent, unspecified: Secondary | ICD-10-CM | POA: Diagnosis not present

## 2018-02-12 DIAGNOSIS — I25119 Atherosclerotic heart disease of native coronary artery with unspecified angina pectoris: Secondary | ICD-10-CM | POA: Diagnosis not present

## 2018-02-12 DIAGNOSIS — D638 Anemia in other chronic diseases classified elsewhere: Secondary | ICD-10-CM | POA: Diagnosis not present

## 2018-02-12 DIAGNOSIS — J449 Chronic obstructive pulmonary disease, unspecified: Secondary | ICD-10-CM | POA: Diagnosis not present

## 2018-02-12 DIAGNOSIS — K219 Gastro-esophageal reflux disease without esophagitis: Secondary | ICD-10-CM | POA: Diagnosis not present

## 2018-02-13 DIAGNOSIS — E46 Unspecified protein-calorie malnutrition: Secondary | ICD-10-CM | POA: Diagnosis not present

## 2018-02-13 DIAGNOSIS — E1159 Type 2 diabetes mellitus with other circulatory complications: Secondary | ICD-10-CM | POA: Diagnosis not present

## 2018-02-13 DIAGNOSIS — I25119 Atherosclerotic heart disease of native coronary artery with unspecified angina pectoris: Secondary | ICD-10-CM | POA: Diagnosis not present

## 2018-02-13 DIAGNOSIS — J9620 Acute and chronic respiratory failure, unspecified whether with hypoxia or hypercapnia: Secondary | ICD-10-CM | POA: Diagnosis not present

## 2018-02-13 DIAGNOSIS — D638 Anemia in other chronic diseases classified elsewhere: Secondary | ICD-10-CM | POA: Diagnosis not present

## 2018-02-13 DIAGNOSIS — J449 Chronic obstructive pulmonary disease, unspecified: Secondary | ICD-10-CM | POA: Diagnosis not present

## 2018-02-14 DIAGNOSIS — J449 Chronic obstructive pulmonary disease, unspecified: Secondary | ICD-10-CM | POA: Diagnosis not present

## 2018-02-14 DIAGNOSIS — F332 Major depressive disorder, recurrent severe without psychotic features: Secondary | ICD-10-CM | POA: Diagnosis not present

## 2018-02-14 DIAGNOSIS — G4709 Other insomnia: Secondary | ICD-10-CM | POA: Diagnosis not present

## 2018-02-14 DIAGNOSIS — D509 Iron deficiency anemia, unspecified: Secondary | ICD-10-CM | POA: Diagnosis not present

## 2018-02-14 DIAGNOSIS — E1169 Type 2 diabetes mellitus with other specified complication: Secondary | ICD-10-CM | POA: Diagnosis not present

## 2018-02-14 DIAGNOSIS — R634 Abnormal weight loss: Secondary | ICD-10-CM | POA: Diagnosis not present

## 2018-02-14 DIAGNOSIS — D508 Other iron deficiency anemias: Secondary | ICD-10-CM | POA: Diagnosis not present

## 2018-02-14 DIAGNOSIS — M25511 Pain in right shoulder: Secondary | ICD-10-CM | POA: Diagnosis not present

## 2018-02-14 DIAGNOSIS — Z6820 Body mass index (BMI) 20.0-20.9, adult: Secondary | ICD-10-CM | POA: Diagnosis not present

## 2018-02-17 DIAGNOSIS — J449 Chronic obstructive pulmonary disease, unspecified: Secondary | ICD-10-CM | POA: Diagnosis not present

## 2018-02-17 DIAGNOSIS — E46 Unspecified protein-calorie malnutrition: Secondary | ICD-10-CM | POA: Diagnosis not present

## 2018-02-17 DIAGNOSIS — J9620 Acute and chronic respiratory failure, unspecified whether with hypoxia or hypercapnia: Secondary | ICD-10-CM | POA: Diagnosis not present

## 2018-02-17 DIAGNOSIS — E1159 Type 2 diabetes mellitus with other circulatory complications: Secondary | ICD-10-CM | POA: Diagnosis not present

## 2018-02-17 DIAGNOSIS — I25119 Atherosclerotic heart disease of native coronary artery with unspecified angina pectoris: Secondary | ICD-10-CM | POA: Diagnosis not present

## 2018-02-17 DIAGNOSIS — D638 Anemia in other chronic diseases classified elsewhere: Secondary | ICD-10-CM | POA: Diagnosis not present

## 2018-02-18 DIAGNOSIS — E1159 Type 2 diabetes mellitus with other circulatory complications: Secondary | ICD-10-CM | POA: Diagnosis not present

## 2018-02-18 DIAGNOSIS — J9620 Acute and chronic respiratory failure, unspecified whether with hypoxia or hypercapnia: Secondary | ICD-10-CM | POA: Diagnosis not present

## 2018-02-18 DIAGNOSIS — D638 Anemia in other chronic diseases classified elsewhere: Secondary | ICD-10-CM | POA: Diagnosis not present

## 2018-02-18 DIAGNOSIS — E46 Unspecified protein-calorie malnutrition: Secondary | ICD-10-CM | POA: Diagnosis not present

## 2018-02-18 DIAGNOSIS — J449 Chronic obstructive pulmonary disease, unspecified: Secondary | ICD-10-CM | POA: Diagnosis not present

## 2018-02-18 DIAGNOSIS — I25119 Atherosclerotic heart disease of native coronary artery with unspecified angina pectoris: Secondary | ICD-10-CM | POA: Diagnosis not present

## 2018-02-25 DIAGNOSIS — J9620 Acute and chronic respiratory failure, unspecified whether with hypoxia or hypercapnia: Secondary | ICD-10-CM | POA: Diagnosis not present

## 2018-02-25 DIAGNOSIS — I25119 Atherosclerotic heart disease of native coronary artery with unspecified angina pectoris: Secondary | ICD-10-CM | POA: Diagnosis not present

## 2018-02-25 DIAGNOSIS — D638 Anemia in other chronic diseases classified elsewhere: Secondary | ICD-10-CM | POA: Diagnosis not present

## 2018-02-25 DIAGNOSIS — E1159 Type 2 diabetes mellitus with other circulatory complications: Secondary | ICD-10-CM | POA: Diagnosis not present

## 2018-02-25 DIAGNOSIS — J449 Chronic obstructive pulmonary disease, unspecified: Secondary | ICD-10-CM | POA: Diagnosis not present

## 2018-02-25 DIAGNOSIS — E46 Unspecified protein-calorie malnutrition: Secondary | ICD-10-CM | POA: Diagnosis not present

## 2018-02-27 DIAGNOSIS — E1159 Type 2 diabetes mellitus with other circulatory complications: Secondary | ICD-10-CM | POA: Diagnosis not present

## 2018-02-27 DIAGNOSIS — D638 Anemia in other chronic diseases classified elsewhere: Secondary | ICD-10-CM | POA: Diagnosis not present

## 2018-02-27 DIAGNOSIS — E46 Unspecified protein-calorie malnutrition: Secondary | ICD-10-CM | POA: Diagnosis not present

## 2018-02-27 DIAGNOSIS — I25119 Atherosclerotic heart disease of native coronary artery with unspecified angina pectoris: Secondary | ICD-10-CM | POA: Diagnosis not present

## 2018-02-27 DIAGNOSIS — J9620 Acute and chronic respiratory failure, unspecified whether with hypoxia or hypercapnia: Secondary | ICD-10-CM | POA: Diagnosis not present

## 2018-02-27 DIAGNOSIS — J449 Chronic obstructive pulmonary disease, unspecified: Secondary | ICD-10-CM | POA: Diagnosis not present

## 2018-03-04 DIAGNOSIS — I25119 Atherosclerotic heart disease of native coronary artery with unspecified angina pectoris: Secondary | ICD-10-CM | POA: Diagnosis not present

## 2018-03-04 DIAGNOSIS — J449 Chronic obstructive pulmonary disease, unspecified: Secondary | ICD-10-CM | POA: Diagnosis not present

## 2018-03-04 DIAGNOSIS — E46 Unspecified protein-calorie malnutrition: Secondary | ICD-10-CM | POA: Diagnosis not present

## 2018-03-04 DIAGNOSIS — E1159 Type 2 diabetes mellitus with other circulatory complications: Secondary | ICD-10-CM | POA: Diagnosis not present

## 2018-03-04 DIAGNOSIS — J9620 Acute and chronic respiratory failure, unspecified whether with hypoxia or hypercapnia: Secondary | ICD-10-CM | POA: Diagnosis not present

## 2018-03-04 DIAGNOSIS — D638 Anemia in other chronic diseases classified elsewhere: Secondary | ICD-10-CM | POA: Diagnosis not present

## 2018-03-08 ENCOUNTER — Other Ambulatory Visit: Payer: Self-pay | Admitting: Internal Medicine

## 2018-03-11 DIAGNOSIS — E46 Unspecified protein-calorie malnutrition: Secondary | ICD-10-CM | POA: Diagnosis not present

## 2018-03-11 DIAGNOSIS — E1159 Type 2 diabetes mellitus with other circulatory complications: Secondary | ICD-10-CM | POA: Diagnosis not present

## 2018-03-11 DIAGNOSIS — D638 Anemia in other chronic diseases classified elsewhere: Secondary | ICD-10-CM | POA: Diagnosis not present

## 2018-03-11 DIAGNOSIS — J9620 Acute and chronic respiratory failure, unspecified whether with hypoxia or hypercapnia: Secondary | ICD-10-CM | POA: Diagnosis not present

## 2018-03-11 DIAGNOSIS — I25119 Atherosclerotic heart disease of native coronary artery with unspecified angina pectoris: Secondary | ICD-10-CM | POA: Diagnosis not present

## 2018-03-11 DIAGNOSIS — J449 Chronic obstructive pulmonary disease, unspecified: Secondary | ICD-10-CM | POA: Diagnosis not present

## 2018-03-13 DIAGNOSIS — J449 Chronic obstructive pulmonary disease, unspecified: Secondary | ICD-10-CM | POA: Diagnosis not present

## 2018-03-13 DIAGNOSIS — D638 Anemia in other chronic diseases classified elsewhere: Secondary | ICD-10-CM | POA: Diagnosis not present

## 2018-03-13 DIAGNOSIS — J9620 Acute and chronic respiratory failure, unspecified whether with hypoxia or hypercapnia: Secondary | ICD-10-CM | POA: Diagnosis not present

## 2018-03-13 DIAGNOSIS — E1159 Type 2 diabetes mellitus with other circulatory complications: Secondary | ICD-10-CM | POA: Diagnosis not present

## 2018-03-13 DIAGNOSIS — I25119 Atherosclerotic heart disease of native coronary artery with unspecified angina pectoris: Secondary | ICD-10-CM | POA: Diagnosis not present

## 2018-03-13 DIAGNOSIS — E46 Unspecified protein-calorie malnutrition: Secondary | ICD-10-CM | POA: Diagnosis not present

## 2018-03-15 DIAGNOSIS — E46 Unspecified protein-calorie malnutrition: Secondary | ICD-10-CM | POA: Diagnosis not present

## 2018-03-15 DIAGNOSIS — E1159 Type 2 diabetes mellitus with other circulatory complications: Secondary | ICD-10-CM | POA: Diagnosis not present

## 2018-03-15 DIAGNOSIS — F0280 Dementia in other diseases classified elsewhere without behavioral disturbance: Secondary | ICD-10-CM | POA: Diagnosis not present

## 2018-03-15 DIAGNOSIS — K219 Gastro-esophageal reflux disease without esophagitis: Secondary | ICD-10-CM | POA: Diagnosis not present

## 2018-03-15 DIAGNOSIS — J9620 Acute and chronic respiratory failure, unspecified whether with hypoxia or hypercapnia: Secondary | ICD-10-CM | POA: Diagnosis not present

## 2018-03-15 DIAGNOSIS — I25119 Atherosclerotic heart disease of native coronary artery with unspecified angina pectoris: Secondary | ICD-10-CM | POA: Diagnosis not present

## 2018-03-15 DIAGNOSIS — R1310 Dysphagia, unspecified: Secondary | ICD-10-CM | POA: Diagnosis not present

## 2018-03-15 DIAGNOSIS — F339 Major depressive disorder, recurrent, unspecified: Secondary | ICD-10-CM | POA: Diagnosis not present

## 2018-03-15 DIAGNOSIS — D638 Anemia in other chronic diseases classified elsewhere: Secondary | ICD-10-CM | POA: Diagnosis not present

## 2018-03-15 DIAGNOSIS — J449 Chronic obstructive pulmonary disease, unspecified: Secondary | ICD-10-CM | POA: Diagnosis not present

## 2018-03-18 DIAGNOSIS — D638 Anemia in other chronic diseases classified elsewhere: Secondary | ICD-10-CM | POA: Diagnosis not present

## 2018-03-18 DIAGNOSIS — J9620 Acute and chronic respiratory failure, unspecified whether with hypoxia or hypercapnia: Secondary | ICD-10-CM | POA: Diagnosis not present

## 2018-03-18 DIAGNOSIS — J449 Chronic obstructive pulmonary disease, unspecified: Secondary | ICD-10-CM | POA: Diagnosis not present

## 2018-03-18 DIAGNOSIS — I25119 Atherosclerotic heart disease of native coronary artery with unspecified angina pectoris: Secondary | ICD-10-CM | POA: Diagnosis not present

## 2018-03-18 DIAGNOSIS — E46 Unspecified protein-calorie malnutrition: Secondary | ICD-10-CM | POA: Diagnosis not present

## 2018-03-18 DIAGNOSIS — E1159 Type 2 diabetes mellitus with other circulatory complications: Secondary | ICD-10-CM | POA: Diagnosis not present

## 2018-03-20 DIAGNOSIS — J449 Chronic obstructive pulmonary disease, unspecified: Secondary | ICD-10-CM | POA: Diagnosis not present

## 2018-03-20 DIAGNOSIS — D638 Anemia in other chronic diseases classified elsewhere: Secondary | ICD-10-CM | POA: Diagnosis not present

## 2018-03-20 DIAGNOSIS — I25119 Atherosclerotic heart disease of native coronary artery with unspecified angina pectoris: Secondary | ICD-10-CM | POA: Diagnosis not present

## 2018-03-20 DIAGNOSIS — E1159 Type 2 diabetes mellitus with other circulatory complications: Secondary | ICD-10-CM | POA: Diagnosis not present

## 2018-03-20 DIAGNOSIS — J9620 Acute and chronic respiratory failure, unspecified whether with hypoxia or hypercapnia: Secondary | ICD-10-CM | POA: Diagnosis not present

## 2018-03-20 DIAGNOSIS — E46 Unspecified protein-calorie malnutrition: Secondary | ICD-10-CM | POA: Diagnosis not present

## 2018-03-25 DIAGNOSIS — I25119 Atherosclerotic heart disease of native coronary artery with unspecified angina pectoris: Secondary | ICD-10-CM | POA: Diagnosis not present

## 2018-03-25 DIAGNOSIS — D638 Anemia in other chronic diseases classified elsewhere: Secondary | ICD-10-CM | POA: Diagnosis not present

## 2018-03-25 DIAGNOSIS — J449 Chronic obstructive pulmonary disease, unspecified: Secondary | ICD-10-CM | POA: Diagnosis not present

## 2018-03-25 DIAGNOSIS — E46 Unspecified protein-calorie malnutrition: Secondary | ICD-10-CM | POA: Diagnosis not present

## 2018-03-25 DIAGNOSIS — E1159 Type 2 diabetes mellitus with other circulatory complications: Secondary | ICD-10-CM | POA: Diagnosis not present

## 2018-03-25 DIAGNOSIS — J9620 Acute and chronic respiratory failure, unspecified whether with hypoxia or hypercapnia: Secondary | ICD-10-CM | POA: Diagnosis not present

## 2018-03-27 DIAGNOSIS — E1159 Type 2 diabetes mellitus with other circulatory complications: Secondary | ICD-10-CM | POA: Diagnosis not present

## 2018-03-27 DIAGNOSIS — J9620 Acute and chronic respiratory failure, unspecified whether with hypoxia or hypercapnia: Secondary | ICD-10-CM | POA: Diagnosis not present

## 2018-03-27 DIAGNOSIS — J449 Chronic obstructive pulmonary disease, unspecified: Secondary | ICD-10-CM | POA: Diagnosis not present

## 2018-03-27 DIAGNOSIS — E46 Unspecified protein-calorie malnutrition: Secondary | ICD-10-CM | POA: Diagnosis not present

## 2018-03-27 DIAGNOSIS — I25119 Atherosclerotic heart disease of native coronary artery with unspecified angina pectoris: Secondary | ICD-10-CM | POA: Diagnosis not present

## 2018-03-27 DIAGNOSIS — D638 Anemia in other chronic diseases classified elsewhere: Secondary | ICD-10-CM | POA: Diagnosis not present

## 2018-03-31 DIAGNOSIS — E46 Unspecified protein-calorie malnutrition: Secondary | ICD-10-CM | POA: Diagnosis not present

## 2018-03-31 DIAGNOSIS — E1159 Type 2 diabetes mellitus with other circulatory complications: Secondary | ICD-10-CM | POA: Diagnosis not present

## 2018-03-31 DIAGNOSIS — D638 Anemia in other chronic diseases classified elsewhere: Secondary | ICD-10-CM | POA: Diagnosis not present

## 2018-03-31 DIAGNOSIS — I25119 Atherosclerotic heart disease of native coronary artery with unspecified angina pectoris: Secondary | ICD-10-CM | POA: Diagnosis not present

## 2018-03-31 DIAGNOSIS — J449 Chronic obstructive pulmonary disease, unspecified: Secondary | ICD-10-CM | POA: Diagnosis not present

## 2018-03-31 DIAGNOSIS — J9620 Acute and chronic respiratory failure, unspecified whether with hypoxia or hypercapnia: Secondary | ICD-10-CM | POA: Diagnosis not present

## 2018-04-01 DIAGNOSIS — D649 Anemia, unspecified: Secondary | ICD-10-CM | POA: Diagnosis not present

## 2018-04-01 DIAGNOSIS — I519 Heart disease, unspecified: Secondary | ICD-10-CM | POA: Diagnosis not present

## 2018-04-01 DIAGNOSIS — F329 Major depressive disorder, single episode, unspecified: Secondary | ICD-10-CM | POA: Diagnosis not present

## 2018-04-01 DIAGNOSIS — F0390 Unspecified dementia without behavioral disturbance: Secondary | ICD-10-CM | POA: Diagnosis not present

## 2018-04-01 DIAGNOSIS — E0865 Diabetes mellitus due to underlying condition with hyperglycemia: Secondary | ICD-10-CM | POA: Diagnosis not present

## 2018-04-01 DIAGNOSIS — J449 Chronic obstructive pulmonary disease, unspecified: Secondary | ICD-10-CM | POA: Diagnosis not present

## 2018-04-01 DIAGNOSIS — R1310 Dysphagia, unspecified: Secondary | ICD-10-CM | POA: Diagnosis not present

## 2018-04-01 DIAGNOSIS — J9620 Acute and chronic respiratory failure, unspecified whether with hypoxia or hypercapnia: Secondary | ICD-10-CM | POA: Diagnosis not present

## 2018-04-02 DIAGNOSIS — R1310 Dysphagia, unspecified: Secondary | ICD-10-CM | POA: Diagnosis not present

## 2018-04-02 DIAGNOSIS — D649 Anemia, unspecified: Secondary | ICD-10-CM | POA: Diagnosis not present

## 2018-04-02 DIAGNOSIS — E0865 Diabetes mellitus due to underlying condition with hyperglycemia: Secondary | ICD-10-CM | POA: Diagnosis not present

## 2018-04-02 DIAGNOSIS — I519 Heart disease, unspecified: Secondary | ICD-10-CM | POA: Diagnosis not present

## 2018-04-02 DIAGNOSIS — J449 Chronic obstructive pulmonary disease, unspecified: Secondary | ICD-10-CM | POA: Diagnosis not present

## 2018-04-02 DIAGNOSIS — J9620 Acute and chronic respiratory failure, unspecified whether with hypoxia or hypercapnia: Secondary | ICD-10-CM | POA: Diagnosis not present

## 2018-04-04 DIAGNOSIS — I519 Heart disease, unspecified: Secondary | ICD-10-CM | POA: Diagnosis not present

## 2018-04-04 DIAGNOSIS — J449 Chronic obstructive pulmonary disease, unspecified: Secondary | ICD-10-CM | POA: Diagnosis not present

## 2018-04-04 DIAGNOSIS — J9620 Acute and chronic respiratory failure, unspecified whether with hypoxia or hypercapnia: Secondary | ICD-10-CM | POA: Diagnosis not present

## 2018-04-04 DIAGNOSIS — E0865 Diabetes mellitus due to underlying condition with hyperglycemia: Secondary | ICD-10-CM | POA: Diagnosis not present

## 2018-04-04 DIAGNOSIS — R1310 Dysphagia, unspecified: Secondary | ICD-10-CM | POA: Diagnosis not present

## 2018-04-04 DIAGNOSIS — D649 Anemia, unspecified: Secondary | ICD-10-CM | POA: Diagnosis not present

## 2018-04-08 DIAGNOSIS — R1310 Dysphagia, unspecified: Secondary | ICD-10-CM | POA: Diagnosis not present

## 2018-04-08 DIAGNOSIS — I519 Heart disease, unspecified: Secondary | ICD-10-CM | POA: Diagnosis not present

## 2018-04-08 DIAGNOSIS — E0865 Diabetes mellitus due to underlying condition with hyperglycemia: Secondary | ICD-10-CM | POA: Diagnosis not present

## 2018-04-08 DIAGNOSIS — D649 Anemia, unspecified: Secondary | ICD-10-CM | POA: Diagnosis not present

## 2018-04-08 DIAGNOSIS — J449 Chronic obstructive pulmonary disease, unspecified: Secondary | ICD-10-CM | POA: Diagnosis not present

## 2018-04-08 DIAGNOSIS — J9620 Acute and chronic respiratory failure, unspecified whether with hypoxia or hypercapnia: Secondary | ICD-10-CM | POA: Diagnosis not present

## 2018-04-09 DIAGNOSIS — E0865 Diabetes mellitus due to underlying condition with hyperglycemia: Secondary | ICD-10-CM | POA: Diagnosis not present

## 2018-04-09 DIAGNOSIS — J449 Chronic obstructive pulmonary disease, unspecified: Secondary | ICD-10-CM | POA: Diagnosis not present

## 2018-04-09 DIAGNOSIS — D649 Anemia, unspecified: Secondary | ICD-10-CM | POA: Diagnosis not present

## 2018-04-09 DIAGNOSIS — I519 Heart disease, unspecified: Secondary | ICD-10-CM | POA: Diagnosis not present

## 2018-04-09 DIAGNOSIS — J9620 Acute and chronic respiratory failure, unspecified whether with hypoxia or hypercapnia: Secondary | ICD-10-CM | POA: Diagnosis not present

## 2018-04-09 DIAGNOSIS — R1310 Dysphagia, unspecified: Secondary | ICD-10-CM | POA: Diagnosis not present

## 2018-04-14 DIAGNOSIS — J9620 Acute and chronic respiratory failure, unspecified whether with hypoxia or hypercapnia: Secondary | ICD-10-CM | POA: Diagnosis not present

## 2018-04-14 DIAGNOSIS — I519 Heart disease, unspecified: Secondary | ICD-10-CM | POA: Diagnosis not present

## 2018-04-14 DIAGNOSIS — R1310 Dysphagia, unspecified: Secondary | ICD-10-CM | POA: Diagnosis not present

## 2018-04-14 DIAGNOSIS — D649 Anemia, unspecified: Secondary | ICD-10-CM | POA: Diagnosis not present

## 2018-04-14 DIAGNOSIS — J449 Chronic obstructive pulmonary disease, unspecified: Secondary | ICD-10-CM | POA: Diagnosis not present

## 2018-04-14 DIAGNOSIS — F0390 Unspecified dementia without behavioral disturbance: Secondary | ICD-10-CM | POA: Diagnosis not present

## 2018-04-14 DIAGNOSIS — E0865 Diabetes mellitus due to underlying condition with hyperglycemia: Secondary | ICD-10-CM | POA: Diagnosis not present

## 2018-04-14 DIAGNOSIS — F329 Major depressive disorder, single episode, unspecified: Secondary | ICD-10-CM | POA: Diagnosis not present

## 2018-04-15 DIAGNOSIS — I519 Heart disease, unspecified: Secondary | ICD-10-CM | POA: Diagnosis not present

## 2018-04-15 DIAGNOSIS — J9620 Acute and chronic respiratory failure, unspecified whether with hypoxia or hypercapnia: Secondary | ICD-10-CM | POA: Diagnosis not present

## 2018-04-15 DIAGNOSIS — E0865 Diabetes mellitus due to underlying condition with hyperglycemia: Secondary | ICD-10-CM | POA: Diagnosis not present

## 2018-04-15 DIAGNOSIS — J449 Chronic obstructive pulmonary disease, unspecified: Secondary | ICD-10-CM | POA: Diagnosis not present

## 2018-04-15 DIAGNOSIS — D649 Anemia, unspecified: Secondary | ICD-10-CM | POA: Diagnosis not present

## 2018-04-15 DIAGNOSIS — R1310 Dysphagia, unspecified: Secondary | ICD-10-CM | POA: Diagnosis not present

## 2018-04-16 ENCOUNTER — Other Ambulatory Visit: Payer: Self-pay | Admitting: Internal Medicine

## 2018-04-22 DIAGNOSIS — E0865 Diabetes mellitus due to underlying condition with hyperglycemia: Secondary | ICD-10-CM | POA: Diagnosis not present

## 2018-04-22 DIAGNOSIS — J449 Chronic obstructive pulmonary disease, unspecified: Secondary | ICD-10-CM | POA: Diagnosis not present

## 2018-04-22 DIAGNOSIS — R1310 Dysphagia, unspecified: Secondary | ICD-10-CM | POA: Diagnosis not present

## 2018-04-22 DIAGNOSIS — I519 Heart disease, unspecified: Secondary | ICD-10-CM | POA: Diagnosis not present

## 2018-04-22 DIAGNOSIS — J9620 Acute and chronic respiratory failure, unspecified whether with hypoxia or hypercapnia: Secondary | ICD-10-CM | POA: Diagnosis not present

## 2018-04-22 DIAGNOSIS — D649 Anemia, unspecified: Secondary | ICD-10-CM | POA: Diagnosis not present

## 2018-04-29 DIAGNOSIS — J9620 Acute and chronic respiratory failure, unspecified whether with hypoxia or hypercapnia: Secondary | ICD-10-CM | POA: Diagnosis not present

## 2018-04-29 DIAGNOSIS — J449 Chronic obstructive pulmonary disease, unspecified: Secondary | ICD-10-CM | POA: Diagnosis not present

## 2018-04-29 DIAGNOSIS — D649 Anemia, unspecified: Secondary | ICD-10-CM | POA: Diagnosis not present

## 2018-04-29 DIAGNOSIS — E0865 Diabetes mellitus due to underlying condition with hyperglycemia: Secondary | ICD-10-CM | POA: Diagnosis not present

## 2018-04-29 DIAGNOSIS — R1310 Dysphagia, unspecified: Secondary | ICD-10-CM | POA: Diagnosis not present

## 2018-04-29 DIAGNOSIS — I519 Heart disease, unspecified: Secondary | ICD-10-CM | POA: Diagnosis not present

## 2018-05-05 DIAGNOSIS — I519 Heart disease, unspecified: Secondary | ICD-10-CM | POA: Diagnosis not present

## 2018-05-05 DIAGNOSIS — R1310 Dysphagia, unspecified: Secondary | ICD-10-CM | POA: Diagnosis not present

## 2018-05-05 DIAGNOSIS — E0865 Diabetes mellitus due to underlying condition with hyperglycemia: Secondary | ICD-10-CM | POA: Diagnosis not present

## 2018-05-05 DIAGNOSIS — D649 Anemia, unspecified: Secondary | ICD-10-CM | POA: Diagnosis not present

## 2018-05-05 DIAGNOSIS — J9620 Acute and chronic respiratory failure, unspecified whether with hypoxia or hypercapnia: Secondary | ICD-10-CM | POA: Diagnosis not present

## 2018-05-05 DIAGNOSIS — J449 Chronic obstructive pulmonary disease, unspecified: Secondary | ICD-10-CM | POA: Diagnosis not present

## 2018-05-09 DIAGNOSIS — F0280 Dementia in other diseases classified elsewhere without behavioral disturbance: Secondary | ICD-10-CM | POA: Diagnosis not present

## 2018-05-09 DIAGNOSIS — J449 Chronic obstructive pulmonary disease, unspecified: Secondary | ICD-10-CM | POA: Diagnosis not present

## 2018-05-09 DIAGNOSIS — D638 Anemia in other chronic diseases classified elsewhere: Secondary | ICD-10-CM | POA: Diagnosis not present

## 2018-05-09 DIAGNOSIS — K219 Gastro-esophageal reflux disease without esophagitis: Secondary | ICD-10-CM | POA: Diagnosis not present

## 2018-05-09 DIAGNOSIS — J9620 Acute and chronic respiratory failure, unspecified whether with hypoxia or hypercapnia: Secondary | ICD-10-CM | POA: Diagnosis not present

## 2018-05-09 DIAGNOSIS — R1310 Dysphagia, unspecified: Secondary | ICD-10-CM | POA: Diagnosis not present

## 2018-05-09 DIAGNOSIS — E46 Unspecified protein-calorie malnutrition: Secondary | ICD-10-CM | POA: Diagnosis not present

## 2018-05-09 DIAGNOSIS — E1159 Type 2 diabetes mellitus with other circulatory complications: Secondary | ICD-10-CM | POA: Diagnosis not present

## 2018-05-09 DIAGNOSIS — I25119 Atherosclerotic heart disease of native coronary artery with unspecified angina pectoris: Secondary | ICD-10-CM | POA: Diagnosis not present

## 2018-05-09 DIAGNOSIS — F339 Major depressive disorder, recurrent, unspecified: Secondary | ICD-10-CM | POA: Diagnosis not present

## 2018-05-10 DIAGNOSIS — E46 Unspecified protein-calorie malnutrition: Secondary | ICD-10-CM | POA: Diagnosis not present

## 2018-05-10 DIAGNOSIS — I25119 Atherosclerotic heart disease of native coronary artery with unspecified angina pectoris: Secondary | ICD-10-CM | POA: Diagnosis not present

## 2018-05-10 DIAGNOSIS — J449 Chronic obstructive pulmonary disease, unspecified: Secondary | ICD-10-CM | POA: Diagnosis not present

## 2018-05-10 DIAGNOSIS — D638 Anemia in other chronic diseases classified elsewhere: Secondary | ICD-10-CM | POA: Diagnosis not present

## 2018-05-10 DIAGNOSIS — J9620 Acute and chronic respiratory failure, unspecified whether with hypoxia or hypercapnia: Secondary | ICD-10-CM | POA: Diagnosis not present

## 2018-05-10 DIAGNOSIS — E1159 Type 2 diabetes mellitus with other circulatory complications: Secondary | ICD-10-CM | POA: Diagnosis not present

## 2018-05-12 DIAGNOSIS — E46 Unspecified protein-calorie malnutrition: Secondary | ICD-10-CM | POA: Diagnosis not present

## 2018-05-12 DIAGNOSIS — J9620 Acute and chronic respiratory failure, unspecified whether with hypoxia or hypercapnia: Secondary | ICD-10-CM | POA: Diagnosis not present

## 2018-05-12 DIAGNOSIS — D638 Anemia in other chronic diseases classified elsewhere: Secondary | ICD-10-CM | POA: Diagnosis not present

## 2018-05-12 DIAGNOSIS — J449 Chronic obstructive pulmonary disease, unspecified: Secondary | ICD-10-CM | POA: Diagnosis not present

## 2018-05-12 DIAGNOSIS — E1159 Type 2 diabetes mellitus with other circulatory complications: Secondary | ICD-10-CM | POA: Diagnosis not present

## 2018-05-12 DIAGNOSIS — I25119 Atherosclerotic heart disease of native coronary artery with unspecified angina pectoris: Secondary | ICD-10-CM | POA: Diagnosis not present

## 2018-05-13 DIAGNOSIS — E46 Unspecified protein-calorie malnutrition: Secondary | ICD-10-CM | POA: Diagnosis not present

## 2018-05-13 DIAGNOSIS — D638 Anemia in other chronic diseases classified elsewhere: Secondary | ICD-10-CM | POA: Diagnosis not present

## 2018-05-13 DIAGNOSIS — E1159 Type 2 diabetes mellitus with other circulatory complications: Secondary | ICD-10-CM | POA: Diagnosis not present

## 2018-05-13 DIAGNOSIS — J9620 Acute and chronic respiratory failure, unspecified whether with hypoxia or hypercapnia: Secondary | ICD-10-CM | POA: Diagnosis not present

## 2018-05-13 DIAGNOSIS — J449 Chronic obstructive pulmonary disease, unspecified: Secondary | ICD-10-CM | POA: Diagnosis not present

## 2018-05-13 DIAGNOSIS — I25119 Atherosclerotic heart disease of native coronary artery with unspecified angina pectoris: Secondary | ICD-10-CM | POA: Diagnosis not present

## 2018-05-15 DIAGNOSIS — F0280 Dementia in other diseases classified elsewhere without behavioral disturbance: Secondary | ICD-10-CM | POA: Diagnosis not present

## 2018-05-15 DIAGNOSIS — F339 Major depressive disorder, recurrent, unspecified: Secondary | ICD-10-CM | POA: Diagnosis not present

## 2018-05-15 DIAGNOSIS — J9620 Acute and chronic respiratory failure, unspecified whether with hypoxia or hypercapnia: Secondary | ICD-10-CM | POA: Diagnosis not present

## 2018-05-15 DIAGNOSIS — R1310 Dysphagia, unspecified: Secondary | ICD-10-CM | POA: Diagnosis not present

## 2018-05-15 DIAGNOSIS — E1159 Type 2 diabetes mellitus with other circulatory complications: Secondary | ICD-10-CM | POA: Diagnosis not present

## 2018-05-15 DIAGNOSIS — J449 Chronic obstructive pulmonary disease, unspecified: Secondary | ICD-10-CM | POA: Diagnosis not present

## 2018-05-15 DIAGNOSIS — D638 Anemia in other chronic diseases classified elsewhere: Secondary | ICD-10-CM | POA: Diagnosis not present

## 2018-05-15 DIAGNOSIS — E46 Unspecified protein-calorie malnutrition: Secondary | ICD-10-CM | POA: Diagnosis not present

## 2018-05-15 DIAGNOSIS — I25119 Atherosclerotic heart disease of native coronary artery with unspecified angina pectoris: Secondary | ICD-10-CM | POA: Diagnosis not present

## 2018-05-22 DIAGNOSIS — J9620 Acute and chronic respiratory failure, unspecified whether with hypoxia or hypercapnia: Secondary | ICD-10-CM | POA: Diagnosis not present

## 2018-05-22 DIAGNOSIS — I25119 Atherosclerotic heart disease of native coronary artery with unspecified angina pectoris: Secondary | ICD-10-CM | POA: Diagnosis not present

## 2018-05-22 DIAGNOSIS — E1159 Type 2 diabetes mellitus with other circulatory complications: Secondary | ICD-10-CM | POA: Diagnosis not present

## 2018-05-22 DIAGNOSIS — D638 Anemia in other chronic diseases classified elsewhere: Secondary | ICD-10-CM | POA: Diagnosis not present

## 2018-05-22 DIAGNOSIS — J449 Chronic obstructive pulmonary disease, unspecified: Secondary | ICD-10-CM | POA: Diagnosis not present

## 2018-05-22 DIAGNOSIS — E46 Unspecified protein-calorie malnutrition: Secondary | ICD-10-CM | POA: Diagnosis not present

## 2018-05-28 DIAGNOSIS — D638 Anemia in other chronic diseases classified elsewhere: Secondary | ICD-10-CM | POA: Diagnosis not present

## 2018-05-28 DIAGNOSIS — J449 Chronic obstructive pulmonary disease, unspecified: Secondary | ICD-10-CM | POA: Diagnosis not present

## 2018-05-28 DIAGNOSIS — E46 Unspecified protein-calorie malnutrition: Secondary | ICD-10-CM | POA: Diagnosis not present

## 2018-05-28 DIAGNOSIS — J9620 Acute and chronic respiratory failure, unspecified whether with hypoxia or hypercapnia: Secondary | ICD-10-CM | POA: Diagnosis not present

## 2018-05-28 DIAGNOSIS — E1159 Type 2 diabetes mellitus with other circulatory complications: Secondary | ICD-10-CM | POA: Diagnosis not present

## 2018-05-28 DIAGNOSIS — I25119 Atherosclerotic heart disease of native coronary artery with unspecified angina pectoris: Secondary | ICD-10-CM | POA: Diagnosis not present

## 2018-05-29 DIAGNOSIS — D638 Anemia in other chronic diseases classified elsewhere: Secondary | ICD-10-CM | POA: Diagnosis not present

## 2018-05-29 DIAGNOSIS — J9620 Acute and chronic respiratory failure, unspecified whether with hypoxia or hypercapnia: Secondary | ICD-10-CM | POA: Diagnosis not present

## 2018-05-29 DIAGNOSIS — I25119 Atherosclerotic heart disease of native coronary artery with unspecified angina pectoris: Secondary | ICD-10-CM | POA: Diagnosis not present

## 2018-05-29 DIAGNOSIS — E1159 Type 2 diabetes mellitus with other circulatory complications: Secondary | ICD-10-CM | POA: Diagnosis not present

## 2018-05-29 DIAGNOSIS — E46 Unspecified protein-calorie malnutrition: Secondary | ICD-10-CM | POA: Diagnosis not present

## 2018-05-29 DIAGNOSIS — J449 Chronic obstructive pulmonary disease, unspecified: Secondary | ICD-10-CM | POA: Diagnosis not present

## 2018-06-04 DIAGNOSIS — E1159 Type 2 diabetes mellitus with other circulatory complications: Secondary | ICD-10-CM | POA: Diagnosis not present

## 2018-06-04 DIAGNOSIS — E46 Unspecified protein-calorie malnutrition: Secondary | ICD-10-CM | POA: Diagnosis not present

## 2018-06-04 DIAGNOSIS — J449 Chronic obstructive pulmonary disease, unspecified: Secondary | ICD-10-CM | POA: Diagnosis not present

## 2018-06-04 DIAGNOSIS — I25119 Atherosclerotic heart disease of native coronary artery with unspecified angina pectoris: Secondary | ICD-10-CM | POA: Diagnosis not present

## 2018-06-04 DIAGNOSIS — D638 Anemia in other chronic diseases classified elsewhere: Secondary | ICD-10-CM | POA: Diagnosis not present

## 2018-06-04 DIAGNOSIS — J9620 Acute and chronic respiratory failure, unspecified whether with hypoxia or hypercapnia: Secondary | ICD-10-CM | POA: Diagnosis not present

## 2018-06-05 DIAGNOSIS — J449 Chronic obstructive pulmonary disease, unspecified: Secondary | ICD-10-CM | POA: Diagnosis not present

## 2018-06-05 DIAGNOSIS — E46 Unspecified protein-calorie malnutrition: Secondary | ICD-10-CM | POA: Diagnosis not present

## 2018-06-05 DIAGNOSIS — E1159 Type 2 diabetes mellitus with other circulatory complications: Secondary | ICD-10-CM | POA: Diagnosis not present

## 2018-06-05 DIAGNOSIS — I25119 Atherosclerotic heart disease of native coronary artery with unspecified angina pectoris: Secondary | ICD-10-CM | POA: Diagnosis not present

## 2018-06-05 DIAGNOSIS — J9620 Acute and chronic respiratory failure, unspecified whether with hypoxia or hypercapnia: Secondary | ICD-10-CM | POA: Diagnosis not present

## 2018-06-05 DIAGNOSIS — D638 Anemia in other chronic diseases classified elsewhere: Secondary | ICD-10-CM | POA: Diagnosis not present

## 2018-06-12 DIAGNOSIS — J9620 Acute and chronic respiratory failure, unspecified whether with hypoxia or hypercapnia: Secondary | ICD-10-CM | POA: Diagnosis not present

## 2018-06-12 DIAGNOSIS — I25119 Atherosclerotic heart disease of native coronary artery with unspecified angina pectoris: Secondary | ICD-10-CM | POA: Diagnosis not present

## 2018-06-12 DIAGNOSIS — E46 Unspecified protein-calorie malnutrition: Secondary | ICD-10-CM | POA: Diagnosis not present

## 2018-06-12 DIAGNOSIS — E1159 Type 2 diabetes mellitus with other circulatory complications: Secondary | ICD-10-CM | POA: Diagnosis not present

## 2018-06-12 DIAGNOSIS — D638 Anemia in other chronic diseases classified elsewhere: Secondary | ICD-10-CM | POA: Diagnosis not present

## 2018-06-12 DIAGNOSIS — J449 Chronic obstructive pulmonary disease, unspecified: Secondary | ICD-10-CM | POA: Diagnosis not present

## 2018-06-15 DIAGNOSIS — R1310 Dysphagia, unspecified: Secondary | ICD-10-CM | POA: Diagnosis not present

## 2018-06-15 DIAGNOSIS — E46 Unspecified protein-calorie malnutrition: Secondary | ICD-10-CM | POA: Diagnosis not present

## 2018-06-15 DIAGNOSIS — D638 Anemia in other chronic diseases classified elsewhere: Secondary | ICD-10-CM | POA: Diagnosis not present

## 2018-06-15 DIAGNOSIS — J449 Chronic obstructive pulmonary disease, unspecified: Secondary | ICD-10-CM | POA: Diagnosis not present

## 2018-06-15 DIAGNOSIS — F339 Major depressive disorder, recurrent, unspecified: Secondary | ICD-10-CM | POA: Diagnosis not present

## 2018-06-15 DIAGNOSIS — K219 Gastro-esophageal reflux disease without esophagitis: Secondary | ICD-10-CM | POA: Diagnosis not present

## 2018-06-15 DIAGNOSIS — I25119 Atherosclerotic heart disease of native coronary artery with unspecified angina pectoris: Secondary | ICD-10-CM | POA: Diagnosis not present

## 2018-06-15 DIAGNOSIS — E1159 Type 2 diabetes mellitus with other circulatory complications: Secondary | ICD-10-CM | POA: Diagnosis not present

## 2018-06-15 DIAGNOSIS — J9620 Acute and chronic respiratory failure, unspecified whether with hypoxia or hypercapnia: Secondary | ICD-10-CM | POA: Diagnosis not present

## 2018-06-15 DIAGNOSIS — F0280 Dementia in other diseases classified elsewhere without behavioral disturbance: Secondary | ICD-10-CM | POA: Diagnosis not present

## 2018-06-18 DIAGNOSIS — J449 Chronic obstructive pulmonary disease, unspecified: Secondary | ICD-10-CM | POA: Diagnosis not present

## 2018-06-18 DIAGNOSIS — E1159 Type 2 diabetes mellitus with other circulatory complications: Secondary | ICD-10-CM | POA: Diagnosis not present

## 2018-06-18 DIAGNOSIS — E46 Unspecified protein-calorie malnutrition: Secondary | ICD-10-CM | POA: Diagnosis not present

## 2018-06-18 DIAGNOSIS — J9620 Acute and chronic respiratory failure, unspecified whether with hypoxia or hypercapnia: Secondary | ICD-10-CM | POA: Diagnosis not present

## 2018-06-18 DIAGNOSIS — I25119 Atherosclerotic heart disease of native coronary artery with unspecified angina pectoris: Secondary | ICD-10-CM | POA: Diagnosis not present

## 2018-06-18 DIAGNOSIS — D638 Anemia in other chronic diseases classified elsewhere: Secondary | ICD-10-CM | POA: Diagnosis not present

## 2018-06-19 DIAGNOSIS — E1159 Type 2 diabetes mellitus with other circulatory complications: Secondary | ICD-10-CM | POA: Diagnosis not present

## 2018-06-19 DIAGNOSIS — J449 Chronic obstructive pulmonary disease, unspecified: Secondary | ICD-10-CM | POA: Diagnosis not present

## 2018-06-19 DIAGNOSIS — D638 Anemia in other chronic diseases classified elsewhere: Secondary | ICD-10-CM | POA: Diagnosis not present

## 2018-06-19 DIAGNOSIS — J9620 Acute and chronic respiratory failure, unspecified whether with hypoxia or hypercapnia: Secondary | ICD-10-CM | POA: Diagnosis not present

## 2018-06-19 DIAGNOSIS — E46 Unspecified protein-calorie malnutrition: Secondary | ICD-10-CM | POA: Diagnosis not present

## 2018-06-19 DIAGNOSIS — I25119 Atherosclerotic heart disease of native coronary artery with unspecified angina pectoris: Secondary | ICD-10-CM | POA: Diagnosis not present

## 2018-06-25 DIAGNOSIS — J9620 Acute and chronic respiratory failure, unspecified whether with hypoxia or hypercapnia: Secondary | ICD-10-CM | POA: Diagnosis not present

## 2018-06-25 DIAGNOSIS — I25119 Atherosclerotic heart disease of native coronary artery with unspecified angina pectoris: Secondary | ICD-10-CM | POA: Diagnosis not present

## 2018-06-25 DIAGNOSIS — E1159 Type 2 diabetes mellitus with other circulatory complications: Secondary | ICD-10-CM | POA: Diagnosis not present

## 2018-06-25 DIAGNOSIS — E46 Unspecified protein-calorie malnutrition: Secondary | ICD-10-CM | POA: Diagnosis not present

## 2018-06-25 DIAGNOSIS — J449 Chronic obstructive pulmonary disease, unspecified: Secondary | ICD-10-CM | POA: Diagnosis not present

## 2018-06-25 DIAGNOSIS — D638 Anemia in other chronic diseases classified elsewhere: Secondary | ICD-10-CM | POA: Diagnosis not present

## 2018-06-26 DIAGNOSIS — E46 Unspecified protein-calorie malnutrition: Secondary | ICD-10-CM | POA: Diagnosis not present

## 2018-06-26 DIAGNOSIS — J449 Chronic obstructive pulmonary disease, unspecified: Secondary | ICD-10-CM | POA: Diagnosis not present

## 2018-06-26 DIAGNOSIS — J9620 Acute and chronic respiratory failure, unspecified whether with hypoxia or hypercapnia: Secondary | ICD-10-CM | POA: Diagnosis not present

## 2018-06-26 DIAGNOSIS — I25119 Atherosclerotic heart disease of native coronary artery with unspecified angina pectoris: Secondary | ICD-10-CM | POA: Diagnosis not present

## 2018-06-26 DIAGNOSIS — D638 Anemia in other chronic diseases classified elsewhere: Secondary | ICD-10-CM | POA: Diagnosis not present

## 2018-06-26 DIAGNOSIS — E1159 Type 2 diabetes mellitus with other circulatory complications: Secondary | ICD-10-CM | POA: Diagnosis not present

## 2018-07-02 DIAGNOSIS — E1159 Type 2 diabetes mellitus with other circulatory complications: Secondary | ICD-10-CM | POA: Diagnosis not present

## 2018-07-02 DIAGNOSIS — J9620 Acute and chronic respiratory failure, unspecified whether with hypoxia or hypercapnia: Secondary | ICD-10-CM | POA: Diagnosis not present

## 2018-07-02 DIAGNOSIS — I25119 Atherosclerotic heart disease of native coronary artery with unspecified angina pectoris: Secondary | ICD-10-CM | POA: Diagnosis not present

## 2018-07-02 DIAGNOSIS — E46 Unspecified protein-calorie malnutrition: Secondary | ICD-10-CM | POA: Diagnosis not present

## 2018-07-02 DIAGNOSIS — J449 Chronic obstructive pulmonary disease, unspecified: Secondary | ICD-10-CM | POA: Diagnosis not present

## 2018-07-02 DIAGNOSIS — D638 Anemia in other chronic diseases classified elsewhere: Secondary | ICD-10-CM | POA: Diagnosis not present

## 2018-07-03 DIAGNOSIS — E46 Unspecified protein-calorie malnutrition: Secondary | ICD-10-CM | POA: Diagnosis not present

## 2018-07-03 DIAGNOSIS — J449 Chronic obstructive pulmonary disease, unspecified: Secondary | ICD-10-CM | POA: Diagnosis not present

## 2018-07-03 DIAGNOSIS — J9620 Acute and chronic respiratory failure, unspecified whether with hypoxia or hypercapnia: Secondary | ICD-10-CM | POA: Diagnosis not present

## 2018-07-03 DIAGNOSIS — E1159 Type 2 diabetes mellitus with other circulatory complications: Secondary | ICD-10-CM | POA: Diagnosis not present

## 2018-07-03 DIAGNOSIS — D638 Anemia in other chronic diseases classified elsewhere: Secondary | ICD-10-CM | POA: Diagnosis not present

## 2018-07-03 DIAGNOSIS — I25119 Atherosclerotic heart disease of native coronary artery with unspecified angina pectoris: Secondary | ICD-10-CM | POA: Diagnosis not present

## 2018-07-08 DIAGNOSIS — D638 Anemia in other chronic diseases classified elsewhere: Secondary | ICD-10-CM | POA: Diagnosis not present

## 2018-07-08 DIAGNOSIS — E1159 Type 2 diabetes mellitus with other circulatory complications: Secondary | ICD-10-CM | POA: Diagnosis not present

## 2018-07-08 DIAGNOSIS — E46 Unspecified protein-calorie malnutrition: Secondary | ICD-10-CM | POA: Diagnosis not present

## 2018-07-08 DIAGNOSIS — J449 Chronic obstructive pulmonary disease, unspecified: Secondary | ICD-10-CM | POA: Diagnosis not present

## 2018-07-08 DIAGNOSIS — I25119 Atherosclerotic heart disease of native coronary artery with unspecified angina pectoris: Secondary | ICD-10-CM | POA: Diagnosis not present

## 2018-07-08 DIAGNOSIS — J9620 Acute and chronic respiratory failure, unspecified whether with hypoxia or hypercapnia: Secondary | ICD-10-CM | POA: Diagnosis not present

## 2018-07-10 DIAGNOSIS — E46 Unspecified protein-calorie malnutrition: Secondary | ICD-10-CM | POA: Diagnosis not present

## 2018-07-10 DIAGNOSIS — J9620 Acute and chronic respiratory failure, unspecified whether with hypoxia or hypercapnia: Secondary | ICD-10-CM | POA: Diagnosis not present

## 2018-07-10 DIAGNOSIS — D638 Anemia in other chronic diseases classified elsewhere: Secondary | ICD-10-CM | POA: Diagnosis not present

## 2018-07-10 DIAGNOSIS — J449 Chronic obstructive pulmonary disease, unspecified: Secondary | ICD-10-CM | POA: Diagnosis not present

## 2018-07-10 DIAGNOSIS — I25119 Atherosclerotic heart disease of native coronary artery with unspecified angina pectoris: Secondary | ICD-10-CM | POA: Diagnosis not present

## 2018-07-10 DIAGNOSIS — E1159 Type 2 diabetes mellitus with other circulatory complications: Secondary | ICD-10-CM | POA: Diagnosis not present

## 2018-07-11 DIAGNOSIS — E46 Unspecified protein-calorie malnutrition: Secondary | ICD-10-CM | POA: Diagnosis not present

## 2018-07-11 DIAGNOSIS — I25119 Atherosclerotic heart disease of native coronary artery with unspecified angina pectoris: Secondary | ICD-10-CM | POA: Diagnosis not present

## 2018-07-11 DIAGNOSIS — D638 Anemia in other chronic diseases classified elsewhere: Secondary | ICD-10-CM | POA: Diagnosis not present

## 2018-07-11 DIAGNOSIS — J449 Chronic obstructive pulmonary disease, unspecified: Secondary | ICD-10-CM | POA: Diagnosis not present

## 2018-07-11 DIAGNOSIS — E1159 Type 2 diabetes mellitus with other circulatory complications: Secondary | ICD-10-CM | POA: Diagnosis not present

## 2018-07-11 DIAGNOSIS — J9620 Acute and chronic respiratory failure, unspecified whether with hypoxia or hypercapnia: Secondary | ICD-10-CM | POA: Diagnosis not present

## 2018-07-12 DIAGNOSIS — E1159 Type 2 diabetes mellitus with other circulatory complications: Secondary | ICD-10-CM | POA: Diagnosis not present

## 2018-07-12 DIAGNOSIS — J9620 Acute and chronic respiratory failure, unspecified whether with hypoxia or hypercapnia: Secondary | ICD-10-CM | POA: Diagnosis not present

## 2018-07-12 DIAGNOSIS — E46 Unspecified protein-calorie malnutrition: Secondary | ICD-10-CM | POA: Diagnosis not present

## 2018-07-12 DIAGNOSIS — J449 Chronic obstructive pulmonary disease, unspecified: Secondary | ICD-10-CM | POA: Diagnosis not present

## 2018-07-12 DIAGNOSIS — I25119 Atherosclerotic heart disease of native coronary artery with unspecified angina pectoris: Secondary | ICD-10-CM | POA: Diagnosis not present

## 2018-07-12 DIAGNOSIS — D638 Anemia in other chronic diseases classified elsewhere: Secondary | ICD-10-CM | POA: Diagnosis not present

## 2018-07-28 DIAGNOSIS — J9620 Acute and chronic respiratory failure, unspecified whether with hypoxia or hypercapnia: Secondary | ICD-10-CM | POA: Diagnosis not present

## 2018-07-28 DIAGNOSIS — E1159 Type 2 diabetes mellitus with other circulatory complications: Secondary | ICD-10-CM | POA: Diagnosis not present

## 2018-07-28 DIAGNOSIS — I25119 Atherosclerotic heart disease of native coronary artery with unspecified angina pectoris: Secondary | ICD-10-CM | POA: Diagnosis not present

## 2018-07-28 DIAGNOSIS — R1310 Dysphagia, unspecified: Secondary | ICD-10-CM | POA: Diagnosis not present

## 2018-07-28 DIAGNOSIS — E46 Unspecified protein-calorie malnutrition: Secondary | ICD-10-CM | POA: Diagnosis not present

## 2018-07-28 DIAGNOSIS — J449 Chronic obstructive pulmonary disease, unspecified: Secondary | ICD-10-CM | POA: Diagnosis not present

## 2018-07-28 DIAGNOSIS — D638 Anemia in other chronic diseases classified elsewhere: Secondary | ICD-10-CM | POA: Diagnosis not present

## 2018-07-28 DIAGNOSIS — K219 Gastro-esophageal reflux disease without esophagitis: Secondary | ICD-10-CM | POA: Diagnosis not present

## 2018-07-28 DIAGNOSIS — F339 Major depressive disorder, recurrent, unspecified: Secondary | ICD-10-CM | POA: Diagnosis not present

## 2018-07-28 DIAGNOSIS — F0280 Dementia in other diseases classified elsewhere without behavioral disturbance: Secondary | ICD-10-CM | POA: Diagnosis not present

## 2018-07-29 DIAGNOSIS — D638 Anemia in other chronic diseases classified elsewhere: Secondary | ICD-10-CM | POA: Diagnosis not present

## 2018-07-29 DIAGNOSIS — J449 Chronic obstructive pulmonary disease, unspecified: Secondary | ICD-10-CM | POA: Diagnosis not present

## 2018-07-29 DIAGNOSIS — E1159 Type 2 diabetes mellitus with other circulatory complications: Secondary | ICD-10-CM | POA: Diagnosis not present

## 2018-07-29 DIAGNOSIS — E46 Unspecified protein-calorie malnutrition: Secondary | ICD-10-CM | POA: Diagnosis not present

## 2018-07-29 DIAGNOSIS — I25119 Atherosclerotic heart disease of native coronary artery with unspecified angina pectoris: Secondary | ICD-10-CM | POA: Diagnosis not present

## 2018-07-29 DIAGNOSIS — J9620 Acute and chronic respiratory failure, unspecified whether with hypoxia or hypercapnia: Secondary | ICD-10-CM | POA: Diagnosis not present

## 2018-08-06 DIAGNOSIS — E1159 Type 2 diabetes mellitus with other circulatory complications: Secondary | ICD-10-CM | POA: Diagnosis not present

## 2018-08-06 DIAGNOSIS — I25119 Atherosclerotic heart disease of native coronary artery with unspecified angina pectoris: Secondary | ICD-10-CM | POA: Diagnosis not present

## 2018-08-06 DIAGNOSIS — E46 Unspecified protein-calorie malnutrition: Secondary | ICD-10-CM | POA: Diagnosis not present

## 2018-08-06 DIAGNOSIS — J449 Chronic obstructive pulmonary disease, unspecified: Secondary | ICD-10-CM | POA: Diagnosis not present

## 2018-08-06 DIAGNOSIS — J9620 Acute and chronic respiratory failure, unspecified whether with hypoxia or hypercapnia: Secondary | ICD-10-CM | POA: Diagnosis not present

## 2018-08-06 DIAGNOSIS — D638 Anemia in other chronic diseases classified elsewhere: Secondary | ICD-10-CM | POA: Diagnosis not present

## 2018-08-07 DIAGNOSIS — E46 Unspecified protein-calorie malnutrition: Secondary | ICD-10-CM | POA: Diagnosis not present

## 2018-08-07 DIAGNOSIS — E1159 Type 2 diabetes mellitus with other circulatory complications: Secondary | ICD-10-CM | POA: Diagnosis not present

## 2018-08-07 DIAGNOSIS — I25119 Atherosclerotic heart disease of native coronary artery with unspecified angina pectoris: Secondary | ICD-10-CM | POA: Diagnosis not present

## 2018-08-07 DIAGNOSIS — J9620 Acute and chronic respiratory failure, unspecified whether with hypoxia or hypercapnia: Secondary | ICD-10-CM | POA: Diagnosis not present

## 2018-08-07 DIAGNOSIS — J449 Chronic obstructive pulmonary disease, unspecified: Secondary | ICD-10-CM | POA: Diagnosis not present

## 2018-08-07 DIAGNOSIS — D638 Anemia in other chronic diseases classified elsewhere: Secondary | ICD-10-CM | POA: Diagnosis not present

## 2018-08-12 DIAGNOSIS — J449 Chronic obstructive pulmonary disease, unspecified: Secondary | ICD-10-CM | POA: Diagnosis not present

## 2018-08-12 DIAGNOSIS — I25119 Atherosclerotic heart disease of native coronary artery with unspecified angina pectoris: Secondary | ICD-10-CM | POA: Diagnosis not present

## 2018-08-12 DIAGNOSIS — E1159 Type 2 diabetes mellitus with other circulatory complications: Secondary | ICD-10-CM | POA: Diagnosis not present

## 2018-08-12 DIAGNOSIS — D638 Anemia in other chronic diseases classified elsewhere: Secondary | ICD-10-CM | POA: Diagnosis not present

## 2018-08-12 DIAGNOSIS — E46 Unspecified protein-calorie malnutrition: Secondary | ICD-10-CM | POA: Diagnosis not present

## 2018-08-12 DIAGNOSIS — J9620 Acute and chronic respiratory failure, unspecified whether with hypoxia or hypercapnia: Secondary | ICD-10-CM | POA: Diagnosis not present

## 2018-08-13 DIAGNOSIS — J449 Chronic obstructive pulmonary disease, unspecified: Secondary | ICD-10-CM | POA: Diagnosis not present

## 2018-08-13 DIAGNOSIS — E1159 Type 2 diabetes mellitus with other circulatory complications: Secondary | ICD-10-CM | POA: Diagnosis not present

## 2018-08-13 DIAGNOSIS — E46 Unspecified protein-calorie malnutrition: Secondary | ICD-10-CM | POA: Diagnosis not present

## 2018-08-13 DIAGNOSIS — J9620 Acute and chronic respiratory failure, unspecified whether with hypoxia or hypercapnia: Secondary | ICD-10-CM | POA: Diagnosis not present

## 2018-08-13 DIAGNOSIS — D638 Anemia in other chronic diseases classified elsewhere: Secondary | ICD-10-CM | POA: Diagnosis not present

## 2018-08-13 DIAGNOSIS — I25119 Atherosclerotic heart disease of native coronary artery with unspecified angina pectoris: Secondary | ICD-10-CM | POA: Diagnosis not present

## 2018-08-14 DIAGNOSIS — J9620 Acute and chronic respiratory failure, unspecified whether with hypoxia or hypercapnia: Secondary | ICD-10-CM | POA: Diagnosis not present

## 2018-08-14 DIAGNOSIS — I25119 Atherosclerotic heart disease of native coronary artery with unspecified angina pectoris: Secondary | ICD-10-CM | POA: Diagnosis not present

## 2018-08-14 DIAGNOSIS — E46 Unspecified protein-calorie malnutrition: Secondary | ICD-10-CM | POA: Diagnosis not present

## 2018-08-14 DIAGNOSIS — J449 Chronic obstructive pulmonary disease, unspecified: Secondary | ICD-10-CM | POA: Diagnosis not present

## 2018-08-14 DIAGNOSIS — D638 Anemia in other chronic diseases classified elsewhere: Secondary | ICD-10-CM | POA: Diagnosis not present

## 2018-08-14 DIAGNOSIS — E1159 Type 2 diabetes mellitus with other circulatory complications: Secondary | ICD-10-CM | POA: Diagnosis not present

## 2018-08-15 DIAGNOSIS — F339 Major depressive disorder, recurrent, unspecified: Secondary | ICD-10-CM | POA: Diagnosis not present

## 2018-08-15 DIAGNOSIS — R1310 Dysphagia, unspecified: Secondary | ICD-10-CM | POA: Diagnosis not present

## 2018-08-15 DIAGNOSIS — D638 Anemia in other chronic diseases classified elsewhere: Secondary | ICD-10-CM | POA: Diagnosis not present

## 2018-08-15 DIAGNOSIS — E46 Unspecified protein-calorie malnutrition: Secondary | ICD-10-CM | POA: Diagnosis not present

## 2018-08-15 DIAGNOSIS — I25119 Atherosclerotic heart disease of native coronary artery with unspecified angina pectoris: Secondary | ICD-10-CM | POA: Diagnosis not present

## 2018-08-15 DIAGNOSIS — E1159 Type 2 diabetes mellitus with other circulatory complications: Secondary | ICD-10-CM | POA: Diagnosis not present

## 2018-08-15 DIAGNOSIS — J449 Chronic obstructive pulmonary disease, unspecified: Secondary | ICD-10-CM | POA: Diagnosis not present

## 2018-08-15 DIAGNOSIS — F0280 Dementia in other diseases classified elsewhere without behavioral disturbance: Secondary | ICD-10-CM | POA: Diagnosis not present

## 2018-08-15 DIAGNOSIS — J9620 Acute and chronic respiratory failure, unspecified whether with hypoxia or hypercapnia: Secondary | ICD-10-CM | POA: Diagnosis not present

## 2018-08-15 DIAGNOSIS — K219 Gastro-esophageal reflux disease without esophagitis: Secondary | ICD-10-CM | POA: Diagnosis not present

## 2018-08-21 DIAGNOSIS — J9620 Acute and chronic respiratory failure, unspecified whether with hypoxia or hypercapnia: Secondary | ICD-10-CM | POA: Diagnosis not present

## 2018-08-21 DIAGNOSIS — E46 Unspecified protein-calorie malnutrition: Secondary | ICD-10-CM | POA: Diagnosis not present

## 2018-08-21 DIAGNOSIS — J449 Chronic obstructive pulmonary disease, unspecified: Secondary | ICD-10-CM | POA: Diagnosis not present

## 2018-08-21 DIAGNOSIS — I25119 Atherosclerotic heart disease of native coronary artery with unspecified angina pectoris: Secondary | ICD-10-CM | POA: Diagnosis not present

## 2018-08-21 DIAGNOSIS — E1159 Type 2 diabetes mellitus with other circulatory complications: Secondary | ICD-10-CM | POA: Diagnosis not present

## 2018-08-21 DIAGNOSIS — D638 Anemia in other chronic diseases classified elsewhere: Secondary | ICD-10-CM | POA: Diagnosis not present

## 2018-08-23 DIAGNOSIS — D638 Anemia in other chronic diseases classified elsewhere: Secondary | ICD-10-CM | POA: Diagnosis not present

## 2018-08-23 DIAGNOSIS — J449 Chronic obstructive pulmonary disease, unspecified: Secondary | ICD-10-CM | POA: Diagnosis not present

## 2018-08-23 DIAGNOSIS — J9620 Acute and chronic respiratory failure, unspecified whether with hypoxia or hypercapnia: Secondary | ICD-10-CM | POA: Diagnosis not present

## 2018-08-23 DIAGNOSIS — E1159 Type 2 diabetes mellitus with other circulatory complications: Secondary | ICD-10-CM | POA: Diagnosis not present

## 2018-08-23 DIAGNOSIS — E46 Unspecified protein-calorie malnutrition: Secondary | ICD-10-CM | POA: Diagnosis not present

## 2018-08-23 DIAGNOSIS — I25119 Atherosclerotic heart disease of native coronary artery with unspecified angina pectoris: Secondary | ICD-10-CM | POA: Diagnosis not present

## 2018-08-27 DIAGNOSIS — I25119 Atherosclerotic heart disease of native coronary artery with unspecified angina pectoris: Secondary | ICD-10-CM | POA: Diagnosis not present

## 2018-08-27 DIAGNOSIS — J449 Chronic obstructive pulmonary disease, unspecified: Secondary | ICD-10-CM | POA: Diagnosis not present

## 2018-08-27 DIAGNOSIS — J9620 Acute and chronic respiratory failure, unspecified whether with hypoxia or hypercapnia: Secondary | ICD-10-CM | POA: Diagnosis not present

## 2018-08-27 DIAGNOSIS — E46 Unspecified protein-calorie malnutrition: Secondary | ICD-10-CM | POA: Diagnosis not present

## 2018-08-27 DIAGNOSIS — E1159 Type 2 diabetes mellitus with other circulatory complications: Secondary | ICD-10-CM | POA: Diagnosis not present

## 2018-08-27 DIAGNOSIS — D638 Anemia in other chronic diseases classified elsewhere: Secondary | ICD-10-CM | POA: Diagnosis not present

## 2018-08-28 DIAGNOSIS — E46 Unspecified protein-calorie malnutrition: Secondary | ICD-10-CM | POA: Diagnosis not present

## 2018-08-28 DIAGNOSIS — J449 Chronic obstructive pulmonary disease, unspecified: Secondary | ICD-10-CM | POA: Diagnosis not present

## 2018-08-28 DIAGNOSIS — D638 Anemia in other chronic diseases classified elsewhere: Secondary | ICD-10-CM | POA: Diagnosis not present

## 2018-08-28 DIAGNOSIS — J9620 Acute and chronic respiratory failure, unspecified whether with hypoxia or hypercapnia: Secondary | ICD-10-CM | POA: Diagnosis not present

## 2018-08-28 DIAGNOSIS — I25119 Atherosclerotic heart disease of native coronary artery with unspecified angina pectoris: Secondary | ICD-10-CM | POA: Diagnosis not present

## 2018-08-28 DIAGNOSIS — E1159 Type 2 diabetes mellitus with other circulatory complications: Secondary | ICD-10-CM | POA: Diagnosis not present

## 2018-09-03 DIAGNOSIS — J449 Chronic obstructive pulmonary disease, unspecified: Secondary | ICD-10-CM | POA: Diagnosis not present

## 2018-09-03 DIAGNOSIS — E1159 Type 2 diabetes mellitus with other circulatory complications: Secondary | ICD-10-CM | POA: Diagnosis not present

## 2018-09-03 DIAGNOSIS — J9620 Acute and chronic respiratory failure, unspecified whether with hypoxia or hypercapnia: Secondary | ICD-10-CM | POA: Diagnosis not present

## 2018-09-03 DIAGNOSIS — D638 Anemia in other chronic diseases classified elsewhere: Secondary | ICD-10-CM | POA: Diagnosis not present

## 2018-09-03 DIAGNOSIS — I25119 Atherosclerotic heart disease of native coronary artery with unspecified angina pectoris: Secondary | ICD-10-CM | POA: Diagnosis not present

## 2018-09-03 DIAGNOSIS — E46 Unspecified protein-calorie malnutrition: Secondary | ICD-10-CM | POA: Diagnosis not present

## 2018-09-04 DIAGNOSIS — D638 Anemia in other chronic diseases classified elsewhere: Secondary | ICD-10-CM | POA: Diagnosis not present

## 2018-09-04 DIAGNOSIS — I25119 Atherosclerotic heart disease of native coronary artery with unspecified angina pectoris: Secondary | ICD-10-CM | POA: Diagnosis not present

## 2018-09-04 DIAGNOSIS — J9620 Acute and chronic respiratory failure, unspecified whether with hypoxia or hypercapnia: Secondary | ICD-10-CM | POA: Diagnosis not present

## 2018-09-04 DIAGNOSIS — J449 Chronic obstructive pulmonary disease, unspecified: Secondary | ICD-10-CM | POA: Diagnosis not present

## 2018-09-04 DIAGNOSIS — E1159 Type 2 diabetes mellitus with other circulatory complications: Secondary | ICD-10-CM | POA: Diagnosis not present

## 2018-09-04 DIAGNOSIS — E46 Unspecified protein-calorie malnutrition: Secondary | ICD-10-CM | POA: Diagnosis not present

## 2018-09-09 DIAGNOSIS — J449 Chronic obstructive pulmonary disease, unspecified: Secondary | ICD-10-CM | POA: Diagnosis not present

## 2018-09-09 DIAGNOSIS — I25119 Atherosclerotic heart disease of native coronary artery with unspecified angina pectoris: Secondary | ICD-10-CM | POA: Diagnosis not present

## 2018-09-09 DIAGNOSIS — E1159 Type 2 diabetes mellitus with other circulatory complications: Secondary | ICD-10-CM | POA: Diagnosis not present

## 2018-09-09 DIAGNOSIS — E46 Unspecified protein-calorie malnutrition: Secondary | ICD-10-CM | POA: Diagnosis not present

## 2018-09-09 DIAGNOSIS — J9620 Acute and chronic respiratory failure, unspecified whether with hypoxia or hypercapnia: Secondary | ICD-10-CM | POA: Diagnosis not present

## 2018-09-09 DIAGNOSIS — D638 Anemia in other chronic diseases classified elsewhere: Secondary | ICD-10-CM | POA: Diagnosis not present

## 2018-09-14 DIAGNOSIS — J449 Chronic obstructive pulmonary disease, unspecified: Secondary | ICD-10-CM | POA: Diagnosis not present

## 2018-09-14 DIAGNOSIS — I25119 Atherosclerotic heart disease of native coronary artery with unspecified angina pectoris: Secondary | ICD-10-CM | POA: Diagnosis not present

## 2018-09-14 DIAGNOSIS — R1310 Dysphagia, unspecified: Secondary | ICD-10-CM | POA: Diagnosis not present

## 2018-09-14 DIAGNOSIS — F0280 Dementia in other diseases classified elsewhere without behavioral disturbance: Secondary | ICD-10-CM | POA: Diagnosis not present

## 2018-09-14 DIAGNOSIS — E1159 Type 2 diabetes mellitus with other circulatory complications: Secondary | ICD-10-CM | POA: Diagnosis not present

## 2018-09-14 DIAGNOSIS — F339 Major depressive disorder, recurrent, unspecified: Secondary | ICD-10-CM | POA: Diagnosis not present

## 2018-09-14 DIAGNOSIS — E46 Unspecified protein-calorie malnutrition: Secondary | ICD-10-CM | POA: Diagnosis not present

## 2018-09-14 DIAGNOSIS — J9620 Acute and chronic respiratory failure, unspecified whether with hypoxia or hypercapnia: Secondary | ICD-10-CM | POA: Diagnosis not present

## 2018-09-14 DIAGNOSIS — D638 Anemia in other chronic diseases classified elsewhere: Secondary | ICD-10-CM | POA: Diagnosis not present

## 2018-09-14 DIAGNOSIS — K219 Gastro-esophageal reflux disease without esophagitis: Secondary | ICD-10-CM | POA: Diagnosis not present

## 2018-09-17 DIAGNOSIS — E46 Unspecified protein-calorie malnutrition: Secondary | ICD-10-CM | POA: Diagnosis not present

## 2018-09-17 DIAGNOSIS — J449 Chronic obstructive pulmonary disease, unspecified: Secondary | ICD-10-CM | POA: Diagnosis not present

## 2018-09-17 DIAGNOSIS — J9620 Acute and chronic respiratory failure, unspecified whether with hypoxia or hypercapnia: Secondary | ICD-10-CM | POA: Diagnosis not present

## 2018-09-17 DIAGNOSIS — D638 Anemia in other chronic diseases classified elsewhere: Secondary | ICD-10-CM | POA: Diagnosis not present

## 2018-09-17 DIAGNOSIS — I25119 Atherosclerotic heart disease of native coronary artery with unspecified angina pectoris: Secondary | ICD-10-CM | POA: Diagnosis not present

## 2018-09-17 DIAGNOSIS — E1159 Type 2 diabetes mellitus with other circulatory complications: Secondary | ICD-10-CM | POA: Diagnosis not present

## 2018-09-18 DIAGNOSIS — I25119 Atherosclerotic heart disease of native coronary artery with unspecified angina pectoris: Secondary | ICD-10-CM | POA: Diagnosis not present

## 2018-09-18 DIAGNOSIS — J9620 Acute and chronic respiratory failure, unspecified whether with hypoxia or hypercapnia: Secondary | ICD-10-CM | POA: Diagnosis not present

## 2018-09-18 DIAGNOSIS — E1159 Type 2 diabetes mellitus with other circulatory complications: Secondary | ICD-10-CM | POA: Diagnosis not present

## 2018-09-18 DIAGNOSIS — E46 Unspecified protein-calorie malnutrition: Secondary | ICD-10-CM | POA: Diagnosis not present

## 2018-09-18 DIAGNOSIS — D638 Anemia in other chronic diseases classified elsewhere: Secondary | ICD-10-CM | POA: Diagnosis not present

## 2018-09-18 DIAGNOSIS — J449 Chronic obstructive pulmonary disease, unspecified: Secondary | ICD-10-CM | POA: Diagnosis not present

## 2018-09-24 DIAGNOSIS — I25119 Atherosclerotic heart disease of native coronary artery with unspecified angina pectoris: Secondary | ICD-10-CM | POA: Diagnosis not present

## 2018-09-24 DIAGNOSIS — E1159 Type 2 diabetes mellitus with other circulatory complications: Secondary | ICD-10-CM | POA: Diagnosis not present

## 2018-09-24 DIAGNOSIS — D638 Anemia in other chronic diseases classified elsewhere: Secondary | ICD-10-CM | POA: Diagnosis not present

## 2018-09-24 DIAGNOSIS — J9620 Acute and chronic respiratory failure, unspecified whether with hypoxia or hypercapnia: Secondary | ICD-10-CM | POA: Diagnosis not present

## 2018-09-24 DIAGNOSIS — J449 Chronic obstructive pulmonary disease, unspecified: Secondary | ICD-10-CM | POA: Diagnosis not present

## 2018-09-24 DIAGNOSIS — E46 Unspecified protein-calorie malnutrition: Secondary | ICD-10-CM | POA: Diagnosis not present

## 2018-09-25 DIAGNOSIS — D638 Anemia in other chronic diseases classified elsewhere: Secondary | ICD-10-CM | POA: Diagnosis not present

## 2018-09-25 DIAGNOSIS — J9620 Acute and chronic respiratory failure, unspecified whether with hypoxia or hypercapnia: Secondary | ICD-10-CM | POA: Diagnosis not present

## 2018-09-25 DIAGNOSIS — J449 Chronic obstructive pulmonary disease, unspecified: Secondary | ICD-10-CM | POA: Diagnosis not present

## 2018-09-25 DIAGNOSIS — E46 Unspecified protein-calorie malnutrition: Secondary | ICD-10-CM | POA: Diagnosis not present

## 2018-09-25 DIAGNOSIS — E1159 Type 2 diabetes mellitus with other circulatory complications: Secondary | ICD-10-CM | POA: Diagnosis not present

## 2018-09-25 DIAGNOSIS — I25119 Atherosclerotic heart disease of native coronary artery with unspecified angina pectoris: Secondary | ICD-10-CM | POA: Diagnosis not present

## 2018-09-30 DIAGNOSIS — D638 Anemia in other chronic diseases classified elsewhere: Secondary | ICD-10-CM | POA: Diagnosis not present

## 2018-09-30 DIAGNOSIS — J9620 Acute and chronic respiratory failure, unspecified whether with hypoxia or hypercapnia: Secondary | ICD-10-CM | POA: Diagnosis not present

## 2018-09-30 DIAGNOSIS — J449 Chronic obstructive pulmonary disease, unspecified: Secondary | ICD-10-CM | POA: Diagnosis not present

## 2018-09-30 DIAGNOSIS — E1159 Type 2 diabetes mellitus with other circulatory complications: Secondary | ICD-10-CM | POA: Diagnosis not present

## 2018-09-30 DIAGNOSIS — E46 Unspecified protein-calorie malnutrition: Secondary | ICD-10-CM | POA: Diagnosis not present

## 2018-09-30 DIAGNOSIS — I25119 Atherosclerotic heart disease of native coronary artery with unspecified angina pectoris: Secondary | ICD-10-CM | POA: Diagnosis not present

## 2018-10-01 DIAGNOSIS — E46 Unspecified protein-calorie malnutrition: Secondary | ICD-10-CM | POA: Diagnosis not present

## 2018-10-01 DIAGNOSIS — J449 Chronic obstructive pulmonary disease, unspecified: Secondary | ICD-10-CM | POA: Diagnosis not present

## 2018-10-01 DIAGNOSIS — J9620 Acute and chronic respiratory failure, unspecified whether with hypoxia or hypercapnia: Secondary | ICD-10-CM | POA: Diagnosis not present

## 2018-10-01 DIAGNOSIS — E1159 Type 2 diabetes mellitus with other circulatory complications: Secondary | ICD-10-CM | POA: Diagnosis not present

## 2018-10-01 DIAGNOSIS — D638 Anemia in other chronic diseases classified elsewhere: Secondary | ICD-10-CM | POA: Diagnosis not present

## 2018-10-01 DIAGNOSIS — I25119 Atherosclerotic heart disease of native coronary artery with unspecified angina pectoris: Secondary | ICD-10-CM | POA: Diagnosis not present

## 2018-10-03 DIAGNOSIS — D638 Anemia in other chronic diseases classified elsewhere: Secondary | ICD-10-CM | POA: Diagnosis not present

## 2018-10-03 DIAGNOSIS — I25119 Atherosclerotic heart disease of native coronary artery with unspecified angina pectoris: Secondary | ICD-10-CM | POA: Diagnosis not present

## 2018-10-03 DIAGNOSIS — E46 Unspecified protein-calorie malnutrition: Secondary | ICD-10-CM | POA: Diagnosis not present

## 2018-10-03 DIAGNOSIS — J449 Chronic obstructive pulmonary disease, unspecified: Secondary | ICD-10-CM | POA: Diagnosis not present

## 2018-10-03 DIAGNOSIS — E1159 Type 2 diabetes mellitus with other circulatory complications: Secondary | ICD-10-CM | POA: Diagnosis not present

## 2018-10-03 DIAGNOSIS — J9620 Acute and chronic respiratory failure, unspecified whether with hypoxia or hypercapnia: Secondary | ICD-10-CM | POA: Diagnosis not present

## 2018-10-09 DIAGNOSIS — E46 Unspecified protein-calorie malnutrition: Secondary | ICD-10-CM | POA: Diagnosis not present

## 2018-10-09 DIAGNOSIS — I25119 Atherosclerotic heart disease of native coronary artery with unspecified angina pectoris: Secondary | ICD-10-CM | POA: Diagnosis not present

## 2018-10-09 DIAGNOSIS — J9620 Acute and chronic respiratory failure, unspecified whether with hypoxia or hypercapnia: Secondary | ICD-10-CM | POA: Diagnosis not present

## 2018-10-09 DIAGNOSIS — D638 Anemia in other chronic diseases classified elsewhere: Secondary | ICD-10-CM | POA: Diagnosis not present

## 2018-10-09 DIAGNOSIS — E1159 Type 2 diabetes mellitus with other circulatory complications: Secondary | ICD-10-CM | POA: Diagnosis not present

## 2018-10-09 DIAGNOSIS — J449 Chronic obstructive pulmonary disease, unspecified: Secondary | ICD-10-CM | POA: Diagnosis not present

## 2018-10-15 DIAGNOSIS — D638 Anemia in other chronic diseases classified elsewhere: Secondary | ICD-10-CM | POA: Diagnosis not present

## 2018-10-15 DIAGNOSIS — J9620 Acute and chronic respiratory failure, unspecified whether with hypoxia or hypercapnia: Secondary | ICD-10-CM | POA: Diagnosis not present

## 2018-10-15 DIAGNOSIS — K219 Gastro-esophageal reflux disease without esophagitis: Secondary | ICD-10-CM | POA: Diagnosis not present

## 2018-10-15 DIAGNOSIS — E1159 Type 2 diabetes mellitus with other circulatory complications: Secondary | ICD-10-CM | POA: Diagnosis not present

## 2018-10-15 DIAGNOSIS — R1310 Dysphagia, unspecified: Secondary | ICD-10-CM | POA: Diagnosis not present

## 2018-10-15 DIAGNOSIS — E46 Unspecified protein-calorie malnutrition: Secondary | ICD-10-CM | POA: Diagnosis not present

## 2018-10-15 DIAGNOSIS — I25119 Atherosclerotic heart disease of native coronary artery with unspecified angina pectoris: Secondary | ICD-10-CM | POA: Diagnosis not present

## 2018-10-15 DIAGNOSIS — F0280 Dementia in other diseases classified elsewhere without behavioral disturbance: Secondary | ICD-10-CM | POA: Diagnosis not present

## 2018-10-15 DIAGNOSIS — J449 Chronic obstructive pulmonary disease, unspecified: Secondary | ICD-10-CM | POA: Diagnosis not present

## 2018-10-15 DIAGNOSIS — F339 Major depressive disorder, recurrent, unspecified: Secondary | ICD-10-CM | POA: Diagnosis not present

## 2018-10-16 DIAGNOSIS — J449 Chronic obstructive pulmonary disease, unspecified: Secondary | ICD-10-CM | POA: Diagnosis not present

## 2018-10-16 DIAGNOSIS — E1159 Type 2 diabetes mellitus with other circulatory complications: Secondary | ICD-10-CM | POA: Diagnosis not present

## 2018-10-16 DIAGNOSIS — I25119 Atherosclerotic heart disease of native coronary artery with unspecified angina pectoris: Secondary | ICD-10-CM | POA: Diagnosis not present

## 2018-10-16 DIAGNOSIS — J9620 Acute and chronic respiratory failure, unspecified whether with hypoxia or hypercapnia: Secondary | ICD-10-CM | POA: Diagnosis not present

## 2018-10-16 DIAGNOSIS — E46 Unspecified protein-calorie malnutrition: Secondary | ICD-10-CM | POA: Diagnosis not present

## 2018-10-16 DIAGNOSIS — D638 Anemia in other chronic diseases classified elsewhere: Secondary | ICD-10-CM | POA: Diagnosis not present

## 2018-10-22 DIAGNOSIS — J9620 Acute and chronic respiratory failure, unspecified whether with hypoxia or hypercapnia: Secondary | ICD-10-CM | POA: Diagnosis not present

## 2018-10-22 DIAGNOSIS — J449 Chronic obstructive pulmonary disease, unspecified: Secondary | ICD-10-CM | POA: Diagnosis not present

## 2018-10-22 DIAGNOSIS — E46 Unspecified protein-calorie malnutrition: Secondary | ICD-10-CM | POA: Diagnosis not present

## 2018-10-22 DIAGNOSIS — I25119 Atherosclerotic heart disease of native coronary artery with unspecified angina pectoris: Secondary | ICD-10-CM | POA: Diagnosis not present

## 2018-10-22 DIAGNOSIS — E1159 Type 2 diabetes mellitus with other circulatory complications: Secondary | ICD-10-CM | POA: Diagnosis not present

## 2018-10-22 DIAGNOSIS — D638 Anemia in other chronic diseases classified elsewhere: Secondary | ICD-10-CM | POA: Diagnosis not present

## 2018-10-23 DIAGNOSIS — J449 Chronic obstructive pulmonary disease, unspecified: Secondary | ICD-10-CM | POA: Diagnosis not present

## 2018-10-23 DIAGNOSIS — J9620 Acute and chronic respiratory failure, unspecified whether with hypoxia or hypercapnia: Secondary | ICD-10-CM | POA: Diagnosis not present

## 2018-10-23 DIAGNOSIS — I25119 Atherosclerotic heart disease of native coronary artery with unspecified angina pectoris: Secondary | ICD-10-CM | POA: Diagnosis not present

## 2018-10-23 DIAGNOSIS — E1159 Type 2 diabetes mellitus with other circulatory complications: Secondary | ICD-10-CM | POA: Diagnosis not present

## 2018-10-23 DIAGNOSIS — E46 Unspecified protein-calorie malnutrition: Secondary | ICD-10-CM | POA: Diagnosis not present

## 2018-10-23 DIAGNOSIS — D638 Anemia in other chronic diseases classified elsewhere: Secondary | ICD-10-CM | POA: Diagnosis not present

## 2018-10-24 DIAGNOSIS — E46 Unspecified protein-calorie malnutrition: Secondary | ICD-10-CM | POA: Diagnosis not present

## 2018-10-24 DIAGNOSIS — J449 Chronic obstructive pulmonary disease, unspecified: Secondary | ICD-10-CM | POA: Diagnosis not present

## 2018-10-24 DIAGNOSIS — D638 Anemia in other chronic diseases classified elsewhere: Secondary | ICD-10-CM | POA: Diagnosis not present

## 2018-10-24 DIAGNOSIS — I25119 Atherosclerotic heart disease of native coronary artery with unspecified angina pectoris: Secondary | ICD-10-CM | POA: Diagnosis not present

## 2018-10-24 DIAGNOSIS — E1159 Type 2 diabetes mellitus with other circulatory complications: Secondary | ICD-10-CM | POA: Diagnosis not present

## 2018-10-24 DIAGNOSIS — J9620 Acute and chronic respiratory failure, unspecified whether with hypoxia or hypercapnia: Secondary | ICD-10-CM | POA: Diagnosis not present

## 2018-10-28 DIAGNOSIS — J9620 Acute and chronic respiratory failure, unspecified whether with hypoxia or hypercapnia: Secondary | ICD-10-CM | POA: Diagnosis not present

## 2018-10-28 DIAGNOSIS — E1159 Type 2 diabetes mellitus with other circulatory complications: Secondary | ICD-10-CM | POA: Diagnosis not present

## 2018-10-28 DIAGNOSIS — J449 Chronic obstructive pulmonary disease, unspecified: Secondary | ICD-10-CM | POA: Diagnosis not present

## 2018-10-28 DIAGNOSIS — D638 Anemia in other chronic diseases classified elsewhere: Secondary | ICD-10-CM | POA: Diagnosis not present

## 2018-10-28 DIAGNOSIS — I25119 Atherosclerotic heart disease of native coronary artery with unspecified angina pectoris: Secondary | ICD-10-CM | POA: Diagnosis not present

## 2018-10-28 DIAGNOSIS — E46 Unspecified protein-calorie malnutrition: Secondary | ICD-10-CM | POA: Diagnosis not present

## 2018-10-29 DIAGNOSIS — J449 Chronic obstructive pulmonary disease, unspecified: Secondary | ICD-10-CM | POA: Diagnosis not present

## 2018-10-29 DIAGNOSIS — I25119 Atherosclerotic heart disease of native coronary artery with unspecified angina pectoris: Secondary | ICD-10-CM | POA: Diagnosis not present

## 2018-10-29 DIAGNOSIS — E46 Unspecified protein-calorie malnutrition: Secondary | ICD-10-CM | POA: Diagnosis not present

## 2018-10-29 DIAGNOSIS — E1159 Type 2 diabetes mellitus with other circulatory complications: Secondary | ICD-10-CM | POA: Diagnosis not present

## 2018-10-29 DIAGNOSIS — D638 Anemia in other chronic diseases classified elsewhere: Secondary | ICD-10-CM | POA: Diagnosis not present

## 2018-10-29 DIAGNOSIS — J9620 Acute and chronic respiratory failure, unspecified whether with hypoxia or hypercapnia: Secondary | ICD-10-CM | POA: Diagnosis not present

## 2018-11-05 DIAGNOSIS — D638 Anemia in other chronic diseases classified elsewhere: Secondary | ICD-10-CM | POA: Diagnosis not present

## 2018-11-05 DIAGNOSIS — I25119 Atherosclerotic heart disease of native coronary artery with unspecified angina pectoris: Secondary | ICD-10-CM | POA: Diagnosis not present

## 2018-11-05 DIAGNOSIS — J9620 Acute and chronic respiratory failure, unspecified whether with hypoxia or hypercapnia: Secondary | ICD-10-CM | POA: Diagnosis not present

## 2018-11-05 DIAGNOSIS — E46 Unspecified protein-calorie malnutrition: Secondary | ICD-10-CM | POA: Diagnosis not present

## 2018-11-05 DIAGNOSIS — J449 Chronic obstructive pulmonary disease, unspecified: Secondary | ICD-10-CM | POA: Diagnosis not present

## 2018-11-05 DIAGNOSIS — E1159 Type 2 diabetes mellitus with other circulatory complications: Secondary | ICD-10-CM | POA: Diagnosis not present

## 2018-11-06 DIAGNOSIS — E1159 Type 2 diabetes mellitus with other circulatory complications: Secondary | ICD-10-CM | POA: Diagnosis not present

## 2018-11-06 DIAGNOSIS — J449 Chronic obstructive pulmonary disease, unspecified: Secondary | ICD-10-CM | POA: Diagnosis not present

## 2018-11-06 DIAGNOSIS — I25119 Atherosclerotic heart disease of native coronary artery with unspecified angina pectoris: Secondary | ICD-10-CM | POA: Diagnosis not present

## 2018-11-06 DIAGNOSIS — E46 Unspecified protein-calorie malnutrition: Secondary | ICD-10-CM | POA: Diagnosis not present

## 2018-11-06 DIAGNOSIS — J9620 Acute and chronic respiratory failure, unspecified whether with hypoxia or hypercapnia: Secondary | ICD-10-CM | POA: Diagnosis not present

## 2018-11-06 DIAGNOSIS — D638 Anemia in other chronic diseases classified elsewhere: Secondary | ICD-10-CM | POA: Diagnosis not present

## 2018-11-12 DIAGNOSIS — E46 Unspecified protein-calorie malnutrition: Secondary | ICD-10-CM | POA: Diagnosis not present

## 2018-11-12 DIAGNOSIS — D638 Anemia in other chronic diseases classified elsewhere: Secondary | ICD-10-CM | POA: Diagnosis not present

## 2018-11-12 DIAGNOSIS — J449 Chronic obstructive pulmonary disease, unspecified: Secondary | ICD-10-CM | POA: Diagnosis not present

## 2018-11-12 DIAGNOSIS — E1159 Type 2 diabetes mellitus with other circulatory complications: Secondary | ICD-10-CM | POA: Diagnosis not present

## 2018-11-12 DIAGNOSIS — I25119 Atherosclerotic heart disease of native coronary artery with unspecified angina pectoris: Secondary | ICD-10-CM | POA: Diagnosis not present

## 2018-11-12 DIAGNOSIS — J9620 Acute and chronic respiratory failure, unspecified whether with hypoxia or hypercapnia: Secondary | ICD-10-CM | POA: Diagnosis not present

## 2018-11-13 DIAGNOSIS — E1159 Type 2 diabetes mellitus with other circulatory complications: Secondary | ICD-10-CM | POA: Diagnosis not present

## 2018-11-13 DIAGNOSIS — E46 Unspecified protein-calorie malnutrition: Secondary | ICD-10-CM | POA: Diagnosis not present

## 2018-11-13 DIAGNOSIS — J449 Chronic obstructive pulmonary disease, unspecified: Secondary | ICD-10-CM | POA: Diagnosis not present

## 2018-11-13 DIAGNOSIS — J9620 Acute and chronic respiratory failure, unspecified whether with hypoxia or hypercapnia: Secondary | ICD-10-CM | POA: Diagnosis not present

## 2018-11-13 DIAGNOSIS — I25119 Atherosclerotic heart disease of native coronary artery with unspecified angina pectoris: Secondary | ICD-10-CM | POA: Diagnosis not present

## 2018-11-13 DIAGNOSIS — D638 Anemia in other chronic diseases classified elsewhere: Secondary | ICD-10-CM | POA: Diagnosis not present

## 2018-11-15 DIAGNOSIS — E1159 Type 2 diabetes mellitus with other circulatory complications: Secondary | ICD-10-CM | POA: Diagnosis not present

## 2018-11-15 DIAGNOSIS — E46 Unspecified protein-calorie malnutrition: Secondary | ICD-10-CM | POA: Diagnosis not present

## 2018-11-15 DIAGNOSIS — F339 Major depressive disorder, recurrent, unspecified: Secondary | ICD-10-CM | POA: Diagnosis not present

## 2018-11-15 DIAGNOSIS — I25119 Atherosclerotic heart disease of native coronary artery with unspecified angina pectoris: Secondary | ICD-10-CM | POA: Diagnosis not present

## 2018-11-15 DIAGNOSIS — K219 Gastro-esophageal reflux disease without esophagitis: Secondary | ICD-10-CM | POA: Diagnosis not present

## 2018-11-15 DIAGNOSIS — R1310 Dysphagia, unspecified: Secondary | ICD-10-CM | POA: Diagnosis not present

## 2018-11-15 DIAGNOSIS — D638 Anemia in other chronic diseases classified elsewhere: Secondary | ICD-10-CM | POA: Diagnosis not present

## 2018-11-15 DIAGNOSIS — F0280 Dementia in other diseases classified elsewhere without behavioral disturbance: Secondary | ICD-10-CM | POA: Diagnosis not present

## 2018-11-15 DIAGNOSIS — J9620 Acute and chronic respiratory failure, unspecified whether with hypoxia or hypercapnia: Secondary | ICD-10-CM | POA: Diagnosis not present

## 2018-11-15 DIAGNOSIS — J449 Chronic obstructive pulmonary disease, unspecified: Secondary | ICD-10-CM | POA: Diagnosis not present

## 2018-11-20 DIAGNOSIS — D638 Anemia in other chronic diseases classified elsewhere: Secondary | ICD-10-CM | POA: Diagnosis not present

## 2018-11-20 DIAGNOSIS — E1159 Type 2 diabetes mellitus with other circulatory complications: Secondary | ICD-10-CM | POA: Diagnosis not present

## 2018-11-20 DIAGNOSIS — J449 Chronic obstructive pulmonary disease, unspecified: Secondary | ICD-10-CM | POA: Diagnosis not present

## 2018-11-20 DIAGNOSIS — I25119 Atherosclerotic heart disease of native coronary artery with unspecified angina pectoris: Secondary | ICD-10-CM | POA: Diagnosis not present

## 2018-11-20 DIAGNOSIS — J9620 Acute and chronic respiratory failure, unspecified whether with hypoxia or hypercapnia: Secondary | ICD-10-CM | POA: Diagnosis not present

## 2018-11-20 DIAGNOSIS — E46 Unspecified protein-calorie malnutrition: Secondary | ICD-10-CM | POA: Diagnosis not present

## 2018-11-26 DIAGNOSIS — D638 Anemia in other chronic diseases classified elsewhere: Secondary | ICD-10-CM | POA: Diagnosis not present

## 2018-11-26 DIAGNOSIS — J9620 Acute and chronic respiratory failure, unspecified whether with hypoxia or hypercapnia: Secondary | ICD-10-CM | POA: Diagnosis not present

## 2018-11-26 DIAGNOSIS — E46 Unspecified protein-calorie malnutrition: Secondary | ICD-10-CM | POA: Diagnosis not present

## 2018-11-26 DIAGNOSIS — J449 Chronic obstructive pulmonary disease, unspecified: Secondary | ICD-10-CM | POA: Diagnosis not present

## 2018-11-26 DIAGNOSIS — E1159 Type 2 diabetes mellitus with other circulatory complications: Secondary | ICD-10-CM | POA: Diagnosis not present

## 2018-11-26 DIAGNOSIS — I25119 Atherosclerotic heart disease of native coronary artery with unspecified angina pectoris: Secondary | ICD-10-CM | POA: Diagnosis not present

## 2018-11-27 DIAGNOSIS — E1159 Type 2 diabetes mellitus with other circulatory complications: Secondary | ICD-10-CM | POA: Diagnosis not present

## 2018-11-27 DIAGNOSIS — I25119 Atherosclerotic heart disease of native coronary artery with unspecified angina pectoris: Secondary | ICD-10-CM | POA: Diagnosis not present

## 2018-11-27 DIAGNOSIS — D638 Anemia in other chronic diseases classified elsewhere: Secondary | ICD-10-CM | POA: Diagnosis not present

## 2018-11-27 DIAGNOSIS — J9620 Acute and chronic respiratory failure, unspecified whether with hypoxia or hypercapnia: Secondary | ICD-10-CM | POA: Diagnosis not present

## 2018-11-27 DIAGNOSIS — J449 Chronic obstructive pulmonary disease, unspecified: Secondary | ICD-10-CM | POA: Diagnosis not present

## 2018-11-27 DIAGNOSIS — E46 Unspecified protein-calorie malnutrition: Secondary | ICD-10-CM | POA: Diagnosis not present

## 2018-11-29 DIAGNOSIS — J9620 Acute and chronic respiratory failure, unspecified whether with hypoxia or hypercapnia: Secondary | ICD-10-CM | POA: Diagnosis not present

## 2018-11-29 DIAGNOSIS — E1159 Type 2 diabetes mellitus with other circulatory complications: Secondary | ICD-10-CM | POA: Diagnosis not present

## 2018-11-29 DIAGNOSIS — J449 Chronic obstructive pulmonary disease, unspecified: Secondary | ICD-10-CM | POA: Diagnosis not present

## 2018-11-29 DIAGNOSIS — D638 Anemia in other chronic diseases classified elsewhere: Secondary | ICD-10-CM | POA: Diagnosis not present

## 2018-11-29 DIAGNOSIS — I25119 Atherosclerotic heart disease of native coronary artery with unspecified angina pectoris: Secondary | ICD-10-CM | POA: Diagnosis not present

## 2018-11-29 DIAGNOSIS — E46 Unspecified protein-calorie malnutrition: Secondary | ICD-10-CM | POA: Diagnosis not present

## 2018-12-04 DIAGNOSIS — J449 Chronic obstructive pulmonary disease, unspecified: Secondary | ICD-10-CM | POA: Diagnosis not present

## 2018-12-04 DIAGNOSIS — E46 Unspecified protein-calorie malnutrition: Secondary | ICD-10-CM | POA: Diagnosis not present

## 2018-12-04 DIAGNOSIS — E1159 Type 2 diabetes mellitus with other circulatory complications: Secondary | ICD-10-CM | POA: Diagnosis not present

## 2018-12-04 DIAGNOSIS — J9620 Acute and chronic respiratory failure, unspecified whether with hypoxia or hypercapnia: Secondary | ICD-10-CM | POA: Diagnosis not present

## 2018-12-04 DIAGNOSIS — D638 Anemia in other chronic diseases classified elsewhere: Secondary | ICD-10-CM | POA: Diagnosis not present

## 2018-12-04 DIAGNOSIS — I25119 Atherosclerotic heart disease of native coronary artery with unspecified angina pectoris: Secondary | ICD-10-CM | POA: Diagnosis not present

## 2018-12-10 DIAGNOSIS — D638 Anemia in other chronic diseases classified elsewhere: Secondary | ICD-10-CM | POA: Diagnosis not present

## 2018-12-10 DIAGNOSIS — E46 Unspecified protein-calorie malnutrition: Secondary | ICD-10-CM | POA: Diagnosis not present

## 2018-12-10 DIAGNOSIS — E1159 Type 2 diabetes mellitus with other circulatory complications: Secondary | ICD-10-CM | POA: Diagnosis not present

## 2018-12-10 DIAGNOSIS — I25119 Atherosclerotic heart disease of native coronary artery with unspecified angina pectoris: Secondary | ICD-10-CM | POA: Diagnosis not present

## 2018-12-10 DIAGNOSIS — J449 Chronic obstructive pulmonary disease, unspecified: Secondary | ICD-10-CM | POA: Diagnosis not present

## 2018-12-10 DIAGNOSIS — J9620 Acute and chronic respiratory failure, unspecified whether with hypoxia or hypercapnia: Secondary | ICD-10-CM | POA: Diagnosis not present

## 2018-12-11 DIAGNOSIS — I25119 Atherosclerotic heart disease of native coronary artery with unspecified angina pectoris: Secondary | ICD-10-CM | POA: Diagnosis not present

## 2018-12-11 DIAGNOSIS — D638 Anemia in other chronic diseases classified elsewhere: Secondary | ICD-10-CM | POA: Diagnosis not present

## 2018-12-11 DIAGNOSIS — E46 Unspecified protein-calorie malnutrition: Secondary | ICD-10-CM | POA: Diagnosis not present

## 2018-12-11 DIAGNOSIS — E1159 Type 2 diabetes mellitus with other circulatory complications: Secondary | ICD-10-CM | POA: Diagnosis not present

## 2018-12-11 DIAGNOSIS — J9620 Acute and chronic respiratory failure, unspecified whether with hypoxia or hypercapnia: Secondary | ICD-10-CM | POA: Diagnosis not present

## 2018-12-11 DIAGNOSIS — J449 Chronic obstructive pulmonary disease, unspecified: Secondary | ICD-10-CM | POA: Diagnosis not present

## 2018-12-14 DIAGNOSIS — D638 Anemia in other chronic diseases classified elsewhere: Secondary | ICD-10-CM | POA: Diagnosis not present

## 2018-12-14 DIAGNOSIS — J449 Chronic obstructive pulmonary disease, unspecified: Secondary | ICD-10-CM | POA: Diagnosis not present

## 2018-12-14 DIAGNOSIS — K219 Gastro-esophageal reflux disease without esophagitis: Secondary | ICD-10-CM | POA: Diagnosis not present

## 2018-12-14 DIAGNOSIS — F339 Major depressive disorder, recurrent, unspecified: Secondary | ICD-10-CM | POA: Diagnosis not present

## 2018-12-14 DIAGNOSIS — F0280 Dementia in other diseases classified elsewhere without behavioral disturbance: Secondary | ICD-10-CM | POA: Diagnosis not present

## 2018-12-14 DIAGNOSIS — J9620 Acute and chronic respiratory failure, unspecified whether with hypoxia or hypercapnia: Secondary | ICD-10-CM | POA: Diagnosis not present

## 2018-12-14 DIAGNOSIS — E1159 Type 2 diabetes mellitus with other circulatory complications: Secondary | ICD-10-CM | POA: Diagnosis not present

## 2018-12-14 DIAGNOSIS — E46 Unspecified protein-calorie malnutrition: Secondary | ICD-10-CM | POA: Diagnosis not present

## 2018-12-14 DIAGNOSIS — I25119 Atherosclerotic heart disease of native coronary artery with unspecified angina pectoris: Secondary | ICD-10-CM | POA: Diagnosis not present

## 2018-12-14 DIAGNOSIS — R1310 Dysphagia, unspecified: Secondary | ICD-10-CM | POA: Diagnosis not present

## 2018-12-17 DIAGNOSIS — J449 Chronic obstructive pulmonary disease, unspecified: Secondary | ICD-10-CM | POA: Diagnosis not present

## 2018-12-17 DIAGNOSIS — E46 Unspecified protein-calorie malnutrition: Secondary | ICD-10-CM | POA: Diagnosis not present

## 2018-12-17 DIAGNOSIS — I25119 Atherosclerotic heart disease of native coronary artery with unspecified angina pectoris: Secondary | ICD-10-CM | POA: Diagnosis not present

## 2018-12-17 DIAGNOSIS — E1159 Type 2 diabetes mellitus with other circulatory complications: Secondary | ICD-10-CM | POA: Diagnosis not present

## 2018-12-17 DIAGNOSIS — D638 Anemia in other chronic diseases classified elsewhere: Secondary | ICD-10-CM | POA: Diagnosis not present

## 2018-12-17 DIAGNOSIS — J9620 Acute and chronic respiratory failure, unspecified whether with hypoxia or hypercapnia: Secondary | ICD-10-CM | POA: Diagnosis not present

## 2018-12-24 DIAGNOSIS — D638 Anemia in other chronic diseases classified elsewhere: Secondary | ICD-10-CM | POA: Diagnosis not present

## 2018-12-24 DIAGNOSIS — I25119 Atherosclerotic heart disease of native coronary artery with unspecified angina pectoris: Secondary | ICD-10-CM | POA: Diagnosis not present

## 2018-12-24 DIAGNOSIS — J449 Chronic obstructive pulmonary disease, unspecified: Secondary | ICD-10-CM | POA: Diagnosis not present

## 2018-12-24 DIAGNOSIS — E46 Unspecified protein-calorie malnutrition: Secondary | ICD-10-CM | POA: Diagnosis not present

## 2018-12-24 DIAGNOSIS — E1159 Type 2 diabetes mellitus with other circulatory complications: Secondary | ICD-10-CM | POA: Diagnosis not present

## 2018-12-24 DIAGNOSIS — J9620 Acute and chronic respiratory failure, unspecified whether with hypoxia or hypercapnia: Secondary | ICD-10-CM | POA: Diagnosis not present

## 2018-12-29 DIAGNOSIS — E1159 Type 2 diabetes mellitus with other circulatory complications: Secondary | ICD-10-CM | POA: Diagnosis not present

## 2018-12-29 DIAGNOSIS — D638 Anemia in other chronic diseases classified elsewhere: Secondary | ICD-10-CM | POA: Diagnosis not present

## 2018-12-29 DIAGNOSIS — E46 Unspecified protein-calorie malnutrition: Secondary | ICD-10-CM | POA: Diagnosis not present

## 2018-12-29 DIAGNOSIS — J449 Chronic obstructive pulmonary disease, unspecified: Secondary | ICD-10-CM | POA: Diagnosis not present

## 2018-12-29 DIAGNOSIS — I25119 Atherosclerotic heart disease of native coronary artery with unspecified angina pectoris: Secondary | ICD-10-CM | POA: Diagnosis not present

## 2018-12-29 DIAGNOSIS — J9620 Acute and chronic respiratory failure, unspecified whether with hypoxia or hypercapnia: Secondary | ICD-10-CM | POA: Diagnosis not present

## 2018-12-30 DIAGNOSIS — E1159 Type 2 diabetes mellitus with other circulatory complications: Secondary | ICD-10-CM | POA: Diagnosis not present

## 2018-12-30 DIAGNOSIS — J449 Chronic obstructive pulmonary disease, unspecified: Secondary | ICD-10-CM | POA: Diagnosis not present

## 2018-12-30 DIAGNOSIS — D638 Anemia in other chronic diseases classified elsewhere: Secondary | ICD-10-CM | POA: Diagnosis not present

## 2018-12-30 DIAGNOSIS — J9620 Acute and chronic respiratory failure, unspecified whether with hypoxia or hypercapnia: Secondary | ICD-10-CM | POA: Diagnosis not present

## 2018-12-30 DIAGNOSIS — E46 Unspecified protein-calorie malnutrition: Secondary | ICD-10-CM | POA: Diagnosis not present

## 2018-12-30 DIAGNOSIS — I25119 Atherosclerotic heart disease of native coronary artery with unspecified angina pectoris: Secondary | ICD-10-CM | POA: Diagnosis not present

## 2019-01-07 DIAGNOSIS — E1159 Type 2 diabetes mellitus with other circulatory complications: Secondary | ICD-10-CM | POA: Diagnosis not present

## 2019-01-07 DIAGNOSIS — I25119 Atherosclerotic heart disease of native coronary artery with unspecified angina pectoris: Secondary | ICD-10-CM | POA: Diagnosis not present

## 2019-01-07 DIAGNOSIS — J9620 Acute and chronic respiratory failure, unspecified whether with hypoxia or hypercapnia: Secondary | ICD-10-CM | POA: Diagnosis not present

## 2019-01-07 DIAGNOSIS — D638 Anemia in other chronic diseases classified elsewhere: Secondary | ICD-10-CM | POA: Diagnosis not present

## 2019-01-07 DIAGNOSIS — E46 Unspecified protein-calorie malnutrition: Secondary | ICD-10-CM | POA: Diagnosis not present

## 2019-01-07 DIAGNOSIS — J449 Chronic obstructive pulmonary disease, unspecified: Secondary | ICD-10-CM | POA: Diagnosis not present

## 2019-01-08 DIAGNOSIS — E46 Unspecified protein-calorie malnutrition: Secondary | ICD-10-CM | POA: Diagnosis not present

## 2019-01-08 DIAGNOSIS — D638 Anemia in other chronic diseases classified elsewhere: Secondary | ICD-10-CM | POA: Diagnosis not present

## 2019-01-08 DIAGNOSIS — J9620 Acute and chronic respiratory failure, unspecified whether with hypoxia or hypercapnia: Secondary | ICD-10-CM | POA: Diagnosis not present

## 2019-01-08 DIAGNOSIS — J449 Chronic obstructive pulmonary disease, unspecified: Secondary | ICD-10-CM | POA: Diagnosis not present

## 2019-01-08 DIAGNOSIS — E1159 Type 2 diabetes mellitus with other circulatory complications: Secondary | ICD-10-CM | POA: Diagnosis not present

## 2019-01-08 DIAGNOSIS — I25119 Atherosclerotic heart disease of native coronary artery with unspecified angina pectoris: Secondary | ICD-10-CM | POA: Diagnosis not present

## 2019-01-14 DIAGNOSIS — K219 Gastro-esophageal reflux disease without esophagitis: Secondary | ICD-10-CM | POA: Diagnosis not present

## 2019-01-14 DIAGNOSIS — F339 Major depressive disorder, recurrent, unspecified: Secondary | ICD-10-CM | POA: Diagnosis not present

## 2019-01-14 DIAGNOSIS — R1310 Dysphagia, unspecified: Secondary | ICD-10-CM | POA: Diagnosis not present

## 2019-01-14 DIAGNOSIS — D638 Anemia in other chronic diseases classified elsewhere: Secondary | ICD-10-CM | POA: Diagnosis not present

## 2019-01-14 DIAGNOSIS — J9620 Acute and chronic respiratory failure, unspecified whether with hypoxia or hypercapnia: Secondary | ICD-10-CM | POA: Diagnosis not present

## 2019-01-14 DIAGNOSIS — I25119 Atherosclerotic heart disease of native coronary artery with unspecified angina pectoris: Secondary | ICD-10-CM | POA: Diagnosis not present

## 2019-01-14 DIAGNOSIS — E1159 Type 2 diabetes mellitus with other circulatory complications: Secondary | ICD-10-CM | POA: Diagnosis not present

## 2019-01-14 DIAGNOSIS — E46 Unspecified protein-calorie malnutrition: Secondary | ICD-10-CM | POA: Diagnosis not present

## 2019-01-14 DIAGNOSIS — J449 Chronic obstructive pulmonary disease, unspecified: Secondary | ICD-10-CM | POA: Diagnosis not present

## 2019-01-14 DIAGNOSIS — F0280 Dementia in other diseases classified elsewhere without behavioral disturbance: Secondary | ICD-10-CM | POA: Diagnosis not present

## 2019-02-13 DIAGNOSIS — F339 Major depressive disorder, recurrent, unspecified: Secondary | ICD-10-CM | POA: Diagnosis not present

## 2019-02-13 DIAGNOSIS — F0390 Unspecified dementia without behavioral disturbance: Secondary | ICD-10-CM | POA: Diagnosis not present

## 2019-02-13 DIAGNOSIS — J449 Chronic obstructive pulmonary disease, unspecified: Secondary | ICD-10-CM | POA: Diagnosis not present

## 2019-02-13 DIAGNOSIS — K219 Gastro-esophageal reflux disease without esophagitis: Secondary | ICD-10-CM | POA: Diagnosis not present

## 2019-02-13 DIAGNOSIS — J9620 Acute and chronic respiratory failure, unspecified whether with hypoxia or hypercapnia: Secondary | ICD-10-CM | POA: Diagnosis not present

## 2019-02-13 DIAGNOSIS — Z9981 Dependence on supplemental oxygen: Secondary | ICD-10-CM | POA: Diagnosis not present

## 2019-02-13 DIAGNOSIS — Z741 Need for assistance with personal care: Secondary | ICD-10-CM | POA: Diagnosis not present

## 2019-02-13 DIAGNOSIS — R1310 Dysphagia, unspecified: Secondary | ICD-10-CM | POA: Diagnosis not present

## 2019-02-13 DIAGNOSIS — I25119 Atherosclerotic heart disease of native coronary artery with unspecified angina pectoris: Secondary | ICD-10-CM | POA: Diagnosis not present

## 2019-02-13 DIAGNOSIS — E46 Unspecified protein-calorie malnutrition: Secondary | ICD-10-CM | POA: Diagnosis not present

## 2019-02-13 DIAGNOSIS — E1159 Type 2 diabetes mellitus with other circulatory complications: Secondary | ICD-10-CM | POA: Diagnosis not present

## 2019-02-13 DIAGNOSIS — D638 Anemia in other chronic diseases classified elsewhere: Secondary | ICD-10-CM | POA: Diagnosis not present

## 2019-02-18 DIAGNOSIS — J449 Chronic obstructive pulmonary disease, unspecified: Secondary | ICD-10-CM | POA: Diagnosis not present

## 2019-02-18 DIAGNOSIS — J9620 Acute and chronic respiratory failure, unspecified whether with hypoxia or hypercapnia: Secondary | ICD-10-CM | POA: Diagnosis not present

## 2019-02-18 DIAGNOSIS — D638 Anemia in other chronic diseases classified elsewhere: Secondary | ICD-10-CM | POA: Diagnosis not present

## 2019-02-18 DIAGNOSIS — E46 Unspecified protein-calorie malnutrition: Secondary | ICD-10-CM | POA: Diagnosis not present

## 2019-02-18 DIAGNOSIS — E1159 Type 2 diabetes mellitus with other circulatory complications: Secondary | ICD-10-CM | POA: Diagnosis not present

## 2019-02-18 DIAGNOSIS — I25119 Atherosclerotic heart disease of native coronary artery with unspecified angina pectoris: Secondary | ICD-10-CM | POA: Diagnosis not present

## 2019-03-06 DIAGNOSIS — J9620 Acute and chronic respiratory failure, unspecified whether with hypoxia or hypercapnia: Secondary | ICD-10-CM | POA: Diagnosis not present

## 2019-03-06 DIAGNOSIS — I25119 Atherosclerotic heart disease of native coronary artery with unspecified angina pectoris: Secondary | ICD-10-CM | POA: Diagnosis not present

## 2019-03-06 DIAGNOSIS — E1159 Type 2 diabetes mellitus with other circulatory complications: Secondary | ICD-10-CM | POA: Diagnosis not present

## 2019-03-06 DIAGNOSIS — E46 Unspecified protein-calorie malnutrition: Secondary | ICD-10-CM | POA: Diagnosis not present

## 2019-03-06 DIAGNOSIS — J449 Chronic obstructive pulmonary disease, unspecified: Secondary | ICD-10-CM | POA: Diagnosis not present

## 2019-03-06 DIAGNOSIS — D638 Anemia in other chronic diseases classified elsewhere: Secondary | ICD-10-CM | POA: Diagnosis not present

## 2019-03-16 DIAGNOSIS — Z9981 Dependence on supplemental oxygen: Secondary | ICD-10-CM | POA: Diagnosis not present

## 2019-03-16 DIAGNOSIS — K219 Gastro-esophageal reflux disease without esophagitis: Secondary | ICD-10-CM | POA: Diagnosis not present

## 2019-03-16 DIAGNOSIS — E46 Unspecified protein-calorie malnutrition: Secondary | ICD-10-CM | POA: Diagnosis not present

## 2019-03-16 DIAGNOSIS — I25119 Atherosclerotic heart disease of native coronary artery with unspecified angina pectoris: Secondary | ICD-10-CM | POA: Diagnosis not present

## 2019-03-16 DIAGNOSIS — D638 Anemia in other chronic diseases classified elsewhere: Secondary | ICD-10-CM | POA: Diagnosis not present

## 2019-03-16 DIAGNOSIS — J449 Chronic obstructive pulmonary disease, unspecified: Secondary | ICD-10-CM | POA: Diagnosis not present

## 2019-03-16 DIAGNOSIS — F339 Major depressive disorder, recurrent, unspecified: Secondary | ICD-10-CM | POA: Diagnosis not present

## 2019-03-16 DIAGNOSIS — R1310 Dysphagia, unspecified: Secondary | ICD-10-CM | POA: Diagnosis not present

## 2019-03-16 DIAGNOSIS — E1159 Type 2 diabetes mellitus with other circulatory complications: Secondary | ICD-10-CM | POA: Diagnosis not present

## 2019-03-16 DIAGNOSIS — Z741 Need for assistance with personal care: Secondary | ICD-10-CM | POA: Diagnosis not present

## 2019-03-16 DIAGNOSIS — F0390 Unspecified dementia without behavioral disturbance: Secondary | ICD-10-CM | POA: Diagnosis not present

## 2019-03-16 DIAGNOSIS — J9620 Acute and chronic respiratory failure, unspecified whether with hypoxia or hypercapnia: Secondary | ICD-10-CM | POA: Diagnosis not present

## 2019-03-20 DIAGNOSIS — D638 Anemia in other chronic diseases classified elsewhere: Secondary | ICD-10-CM | POA: Diagnosis not present

## 2019-03-20 DIAGNOSIS — I25119 Atherosclerotic heart disease of native coronary artery with unspecified angina pectoris: Secondary | ICD-10-CM | POA: Diagnosis not present

## 2019-03-20 DIAGNOSIS — E1159 Type 2 diabetes mellitus with other circulatory complications: Secondary | ICD-10-CM | POA: Diagnosis not present

## 2019-03-20 DIAGNOSIS — J9620 Acute and chronic respiratory failure, unspecified whether with hypoxia or hypercapnia: Secondary | ICD-10-CM | POA: Diagnosis not present

## 2019-03-20 DIAGNOSIS — J449 Chronic obstructive pulmonary disease, unspecified: Secondary | ICD-10-CM | POA: Diagnosis not present

## 2019-03-20 DIAGNOSIS — E46 Unspecified protein-calorie malnutrition: Secondary | ICD-10-CM | POA: Diagnosis not present

## 2019-03-24 DIAGNOSIS — E46 Unspecified protein-calorie malnutrition: Secondary | ICD-10-CM | POA: Diagnosis not present

## 2019-03-24 DIAGNOSIS — J449 Chronic obstructive pulmonary disease, unspecified: Secondary | ICD-10-CM | POA: Diagnosis not present

## 2019-03-24 DIAGNOSIS — E1159 Type 2 diabetes mellitus with other circulatory complications: Secondary | ICD-10-CM | POA: Diagnosis not present

## 2019-03-24 DIAGNOSIS — J9620 Acute and chronic respiratory failure, unspecified whether with hypoxia or hypercapnia: Secondary | ICD-10-CM | POA: Diagnosis not present

## 2019-03-24 DIAGNOSIS — D638 Anemia in other chronic diseases classified elsewhere: Secondary | ICD-10-CM | POA: Diagnosis not present

## 2019-03-24 DIAGNOSIS — I25119 Atherosclerotic heart disease of native coronary artery with unspecified angina pectoris: Secondary | ICD-10-CM | POA: Diagnosis not present

## 2019-04-15 DIAGNOSIS — J9620 Acute and chronic respiratory failure, unspecified whether with hypoxia or hypercapnia: Secondary | ICD-10-CM | POA: Diagnosis not present

## 2019-04-15 DIAGNOSIS — F329 Major depressive disorder, single episode, unspecified: Secondary | ICD-10-CM | POA: Diagnosis not present

## 2019-04-15 DIAGNOSIS — Z9981 Dependence on supplemental oxygen: Secondary | ICD-10-CM | POA: Diagnosis not present

## 2019-04-15 DIAGNOSIS — D638 Anemia in other chronic diseases classified elsewhere: Secondary | ICD-10-CM | POA: Diagnosis not present

## 2019-04-15 DIAGNOSIS — E46 Unspecified protein-calorie malnutrition: Secondary | ICD-10-CM | POA: Diagnosis not present

## 2019-04-15 DIAGNOSIS — F0390 Unspecified dementia without behavioral disturbance: Secondary | ICD-10-CM | POA: Diagnosis not present

## 2019-04-15 DIAGNOSIS — Z741 Need for assistance with personal care: Secondary | ICD-10-CM | POA: Diagnosis not present

## 2019-04-15 DIAGNOSIS — K219 Gastro-esophageal reflux disease without esophagitis: Secondary | ICD-10-CM | POA: Diagnosis not present

## 2019-04-15 DIAGNOSIS — E1159 Type 2 diabetes mellitus with other circulatory complications: Secondary | ICD-10-CM | POA: Diagnosis not present

## 2019-04-15 DIAGNOSIS — I2510 Atherosclerotic heart disease of native coronary artery without angina pectoris: Secondary | ICD-10-CM | POA: Diagnosis not present

## 2019-04-15 DIAGNOSIS — R1310 Dysphagia, unspecified: Secondary | ICD-10-CM | POA: Diagnosis not present

## 2019-04-15 DIAGNOSIS — J449 Chronic obstructive pulmonary disease, unspecified: Secondary | ICD-10-CM | POA: Diagnosis not present

## 2019-04-16 DIAGNOSIS — J449 Chronic obstructive pulmonary disease, unspecified: Secondary | ICD-10-CM | POA: Diagnosis not present

## 2019-04-16 DIAGNOSIS — E46 Unspecified protein-calorie malnutrition: Secondary | ICD-10-CM | POA: Diagnosis not present

## 2019-04-16 DIAGNOSIS — E1159 Type 2 diabetes mellitus with other circulatory complications: Secondary | ICD-10-CM | POA: Diagnosis not present

## 2019-04-16 DIAGNOSIS — D638 Anemia in other chronic diseases classified elsewhere: Secondary | ICD-10-CM | POA: Diagnosis not present

## 2019-04-16 DIAGNOSIS — J9620 Acute and chronic respiratory failure, unspecified whether with hypoxia or hypercapnia: Secondary | ICD-10-CM | POA: Diagnosis not present

## 2019-04-16 DIAGNOSIS — I2510 Atherosclerotic heart disease of native coronary artery without angina pectoris: Secondary | ICD-10-CM | POA: Diagnosis not present

## 2019-04-17 DIAGNOSIS — E46 Unspecified protein-calorie malnutrition: Secondary | ICD-10-CM | POA: Diagnosis not present

## 2019-04-17 DIAGNOSIS — E1159 Type 2 diabetes mellitus with other circulatory complications: Secondary | ICD-10-CM | POA: Diagnosis not present

## 2019-04-17 DIAGNOSIS — J449 Chronic obstructive pulmonary disease, unspecified: Secondary | ICD-10-CM | POA: Diagnosis not present

## 2019-04-17 DIAGNOSIS — J9620 Acute and chronic respiratory failure, unspecified whether with hypoxia or hypercapnia: Secondary | ICD-10-CM | POA: Diagnosis not present

## 2019-04-17 DIAGNOSIS — I2510 Atherosclerotic heart disease of native coronary artery without angina pectoris: Secondary | ICD-10-CM | POA: Diagnosis not present

## 2019-04-17 DIAGNOSIS — D638 Anemia in other chronic diseases classified elsewhere: Secondary | ICD-10-CM | POA: Diagnosis not present

## 2019-04-24 DIAGNOSIS — J9620 Acute and chronic respiratory failure, unspecified whether with hypoxia or hypercapnia: Secondary | ICD-10-CM | POA: Diagnosis not present

## 2019-04-24 DIAGNOSIS — J449 Chronic obstructive pulmonary disease, unspecified: Secondary | ICD-10-CM | POA: Diagnosis not present

## 2019-04-24 DIAGNOSIS — E46 Unspecified protein-calorie malnutrition: Secondary | ICD-10-CM | POA: Diagnosis not present

## 2019-04-24 DIAGNOSIS — D638 Anemia in other chronic diseases classified elsewhere: Secondary | ICD-10-CM | POA: Diagnosis not present

## 2019-04-24 DIAGNOSIS — I2510 Atherosclerotic heart disease of native coronary artery without angina pectoris: Secondary | ICD-10-CM | POA: Diagnosis not present

## 2019-04-24 DIAGNOSIS — E1159 Type 2 diabetes mellitus with other circulatory complications: Secondary | ICD-10-CM | POA: Diagnosis not present

## 2019-05-01 DIAGNOSIS — D638 Anemia in other chronic diseases classified elsewhere: Secondary | ICD-10-CM | POA: Diagnosis not present

## 2019-05-01 DIAGNOSIS — J9620 Acute and chronic respiratory failure, unspecified whether with hypoxia or hypercapnia: Secondary | ICD-10-CM | POA: Diagnosis not present

## 2019-05-01 DIAGNOSIS — E46 Unspecified protein-calorie malnutrition: Secondary | ICD-10-CM | POA: Diagnosis not present

## 2019-05-01 DIAGNOSIS — J449 Chronic obstructive pulmonary disease, unspecified: Secondary | ICD-10-CM | POA: Diagnosis not present

## 2019-05-01 DIAGNOSIS — I2510 Atherosclerotic heart disease of native coronary artery without angina pectoris: Secondary | ICD-10-CM | POA: Diagnosis not present

## 2019-05-01 DIAGNOSIS — E1159 Type 2 diabetes mellitus with other circulatory complications: Secondary | ICD-10-CM | POA: Diagnosis not present

## 2019-05-08 DIAGNOSIS — E46 Unspecified protein-calorie malnutrition: Secondary | ICD-10-CM | POA: Diagnosis not present

## 2019-05-08 DIAGNOSIS — D638 Anemia in other chronic diseases classified elsewhere: Secondary | ICD-10-CM | POA: Diagnosis not present

## 2019-05-08 DIAGNOSIS — I2510 Atherosclerotic heart disease of native coronary artery without angina pectoris: Secondary | ICD-10-CM | POA: Diagnosis not present

## 2019-05-08 DIAGNOSIS — J9620 Acute and chronic respiratory failure, unspecified whether with hypoxia or hypercapnia: Secondary | ICD-10-CM | POA: Diagnosis not present

## 2019-05-08 DIAGNOSIS — J449 Chronic obstructive pulmonary disease, unspecified: Secondary | ICD-10-CM | POA: Diagnosis not present

## 2019-05-08 DIAGNOSIS — E1159 Type 2 diabetes mellitus with other circulatory complications: Secondary | ICD-10-CM | POA: Diagnosis not present

## 2019-05-15 DIAGNOSIS — E46 Unspecified protein-calorie malnutrition: Secondary | ICD-10-CM | POA: Diagnosis not present

## 2019-05-15 DIAGNOSIS — J9620 Acute and chronic respiratory failure, unspecified whether with hypoxia or hypercapnia: Secondary | ICD-10-CM | POA: Diagnosis not present

## 2019-05-15 DIAGNOSIS — I2510 Atherosclerotic heart disease of native coronary artery without angina pectoris: Secondary | ICD-10-CM | POA: Diagnosis not present

## 2019-05-15 DIAGNOSIS — D638 Anemia in other chronic diseases classified elsewhere: Secondary | ICD-10-CM | POA: Diagnosis not present

## 2019-05-15 DIAGNOSIS — E1159 Type 2 diabetes mellitus with other circulatory complications: Secondary | ICD-10-CM | POA: Diagnosis not present

## 2019-05-15 DIAGNOSIS — J449 Chronic obstructive pulmonary disease, unspecified: Secondary | ICD-10-CM | POA: Diagnosis not present

## 2019-05-16 DIAGNOSIS — F329 Major depressive disorder, single episode, unspecified: Secondary | ICD-10-CM | POA: Diagnosis not present

## 2019-05-16 DIAGNOSIS — K219 Gastro-esophageal reflux disease without esophagitis: Secondary | ICD-10-CM | POA: Diagnosis not present

## 2019-05-16 DIAGNOSIS — Z741 Need for assistance with personal care: Secondary | ICD-10-CM | POA: Diagnosis not present

## 2019-05-16 DIAGNOSIS — J9620 Acute and chronic respiratory failure, unspecified whether with hypoxia or hypercapnia: Secondary | ICD-10-CM | POA: Diagnosis not present

## 2019-05-16 DIAGNOSIS — F0390 Unspecified dementia without behavioral disturbance: Secondary | ICD-10-CM | POA: Diagnosis not present

## 2019-05-16 DIAGNOSIS — Z9981 Dependence on supplemental oxygen: Secondary | ICD-10-CM | POA: Diagnosis not present

## 2019-05-16 DIAGNOSIS — I2510 Atherosclerotic heart disease of native coronary artery without angina pectoris: Secondary | ICD-10-CM | POA: Diagnosis not present

## 2019-05-16 DIAGNOSIS — D638 Anemia in other chronic diseases classified elsewhere: Secondary | ICD-10-CM | POA: Diagnosis not present

## 2019-05-16 DIAGNOSIS — E1159 Type 2 diabetes mellitus with other circulatory complications: Secondary | ICD-10-CM | POA: Diagnosis not present

## 2019-05-16 DIAGNOSIS — R1310 Dysphagia, unspecified: Secondary | ICD-10-CM | POA: Diagnosis not present

## 2019-05-16 DIAGNOSIS — J449 Chronic obstructive pulmonary disease, unspecified: Secondary | ICD-10-CM | POA: Diagnosis not present

## 2019-05-16 DIAGNOSIS — E46 Unspecified protein-calorie malnutrition: Secondary | ICD-10-CM | POA: Diagnosis not present

## 2019-05-22 DIAGNOSIS — E1159 Type 2 diabetes mellitus with other circulatory complications: Secondary | ICD-10-CM | POA: Diagnosis not present

## 2019-05-22 DIAGNOSIS — J449 Chronic obstructive pulmonary disease, unspecified: Secondary | ICD-10-CM | POA: Diagnosis not present

## 2019-05-22 DIAGNOSIS — I2510 Atherosclerotic heart disease of native coronary artery without angina pectoris: Secondary | ICD-10-CM | POA: Diagnosis not present

## 2019-05-22 DIAGNOSIS — E46 Unspecified protein-calorie malnutrition: Secondary | ICD-10-CM | POA: Diagnosis not present

## 2019-05-22 DIAGNOSIS — J9620 Acute and chronic respiratory failure, unspecified whether with hypoxia or hypercapnia: Secondary | ICD-10-CM | POA: Diagnosis not present

## 2019-05-22 DIAGNOSIS — D638 Anemia in other chronic diseases classified elsewhere: Secondary | ICD-10-CM | POA: Diagnosis not present

## 2019-05-29 DIAGNOSIS — D638 Anemia in other chronic diseases classified elsewhere: Secondary | ICD-10-CM | POA: Diagnosis not present

## 2019-05-29 DIAGNOSIS — J449 Chronic obstructive pulmonary disease, unspecified: Secondary | ICD-10-CM | POA: Diagnosis not present

## 2019-05-29 DIAGNOSIS — E46 Unspecified protein-calorie malnutrition: Secondary | ICD-10-CM | POA: Diagnosis not present

## 2019-05-29 DIAGNOSIS — J9620 Acute and chronic respiratory failure, unspecified whether with hypoxia or hypercapnia: Secondary | ICD-10-CM | POA: Diagnosis not present

## 2019-05-29 DIAGNOSIS — I2510 Atherosclerotic heart disease of native coronary artery without angina pectoris: Secondary | ICD-10-CM | POA: Diagnosis not present

## 2019-05-29 DIAGNOSIS — E1159 Type 2 diabetes mellitus with other circulatory complications: Secondary | ICD-10-CM | POA: Diagnosis not present

## 2019-06-10 DIAGNOSIS — I2510 Atherosclerotic heart disease of native coronary artery without angina pectoris: Secondary | ICD-10-CM | POA: Diagnosis not present

## 2019-06-10 DIAGNOSIS — J9620 Acute and chronic respiratory failure, unspecified whether with hypoxia or hypercapnia: Secondary | ICD-10-CM | POA: Diagnosis not present

## 2019-06-10 DIAGNOSIS — E46 Unspecified protein-calorie malnutrition: Secondary | ICD-10-CM | POA: Diagnosis not present

## 2019-06-10 DIAGNOSIS — J449 Chronic obstructive pulmonary disease, unspecified: Secondary | ICD-10-CM | POA: Diagnosis not present

## 2019-06-10 DIAGNOSIS — D638 Anemia in other chronic diseases classified elsewhere: Secondary | ICD-10-CM | POA: Diagnosis not present

## 2019-06-10 DIAGNOSIS — E1159 Type 2 diabetes mellitus with other circulatory complications: Secondary | ICD-10-CM | POA: Diagnosis not present

## 2019-06-12 DIAGNOSIS — J449 Chronic obstructive pulmonary disease, unspecified: Secondary | ICD-10-CM | POA: Diagnosis not present

## 2019-06-12 DIAGNOSIS — E1159 Type 2 diabetes mellitus with other circulatory complications: Secondary | ICD-10-CM | POA: Diagnosis not present

## 2019-06-12 DIAGNOSIS — J9620 Acute and chronic respiratory failure, unspecified whether with hypoxia or hypercapnia: Secondary | ICD-10-CM | POA: Diagnosis not present

## 2019-06-12 DIAGNOSIS — E46 Unspecified protein-calorie malnutrition: Secondary | ICD-10-CM | POA: Diagnosis not present

## 2019-06-12 DIAGNOSIS — I2510 Atherosclerotic heart disease of native coronary artery without angina pectoris: Secondary | ICD-10-CM | POA: Diagnosis not present

## 2019-06-12 DIAGNOSIS — D638 Anemia in other chronic diseases classified elsewhere: Secondary | ICD-10-CM | POA: Diagnosis not present

## 2019-06-16 DIAGNOSIS — F329 Major depressive disorder, single episode, unspecified: Secondary | ICD-10-CM | POA: Diagnosis not present

## 2019-06-16 DIAGNOSIS — D638 Anemia in other chronic diseases classified elsewhere: Secondary | ICD-10-CM | POA: Diagnosis not present

## 2019-06-16 DIAGNOSIS — J9620 Acute and chronic respiratory failure, unspecified whether with hypoxia or hypercapnia: Secondary | ICD-10-CM | POA: Diagnosis not present

## 2019-06-16 DIAGNOSIS — Z741 Need for assistance with personal care: Secondary | ICD-10-CM | POA: Diagnosis not present

## 2019-06-16 DIAGNOSIS — K219 Gastro-esophageal reflux disease without esophagitis: Secondary | ICD-10-CM | POA: Diagnosis not present

## 2019-06-16 DIAGNOSIS — R1310 Dysphagia, unspecified: Secondary | ICD-10-CM | POA: Diagnosis not present

## 2019-06-16 DIAGNOSIS — E1159 Type 2 diabetes mellitus with other circulatory complications: Secondary | ICD-10-CM | POA: Diagnosis not present

## 2019-06-16 DIAGNOSIS — J449 Chronic obstructive pulmonary disease, unspecified: Secondary | ICD-10-CM | POA: Diagnosis not present

## 2019-06-16 DIAGNOSIS — F0390 Unspecified dementia without behavioral disturbance: Secondary | ICD-10-CM | POA: Diagnosis not present

## 2019-06-16 DIAGNOSIS — Z9981 Dependence on supplemental oxygen: Secondary | ICD-10-CM | POA: Diagnosis not present

## 2019-06-16 DIAGNOSIS — I2510 Atherosclerotic heart disease of native coronary artery without angina pectoris: Secondary | ICD-10-CM | POA: Diagnosis not present

## 2019-06-16 DIAGNOSIS — E46 Unspecified protein-calorie malnutrition: Secondary | ICD-10-CM | POA: Diagnosis not present

## 2019-06-18 DIAGNOSIS — E1159 Type 2 diabetes mellitus with other circulatory complications: Secondary | ICD-10-CM | POA: Diagnosis not present

## 2019-06-18 DIAGNOSIS — E46 Unspecified protein-calorie malnutrition: Secondary | ICD-10-CM | POA: Diagnosis not present

## 2019-06-18 DIAGNOSIS — I2510 Atherosclerotic heart disease of native coronary artery without angina pectoris: Secondary | ICD-10-CM | POA: Diagnosis not present

## 2019-06-18 DIAGNOSIS — J449 Chronic obstructive pulmonary disease, unspecified: Secondary | ICD-10-CM | POA: Diagnosis not present

## 2019-06-18 DIAGNOSIS — D638 Anemia in other chronic diseases classified elsewhere: Secondary | ICD-10-CM | POA: Diagnosis not present

## 2019-06-18 DIAGNOSIS — J9620 Acute and chronic respiratory failure, unspecified whether with hypoxia or hypercapnia: Secondary | ICD-10-CM | POA: Diagnosis not present

## 2019-06-19 DIAGNOSIS — I2510 Atherosclerotic heart disease of native coronary artery without angina pectoris: Secondary | ICD-10-CM | POA: Diagnosis not present

## 2019-06-19 DIAGNOSIS — D638 Anemia in other chronic diseases classified elsewhere: Secondary | ICD-10-CM | POA: Diagnosis not present

## 2019-06-19 DIAGNOSIS — J449 Chronic obstructive pulmonary disease, unspecified: Secondary | ICD-10-CM | POA: Diagnosis not present

## 2019-06-19 DIAGNOSIS — E1159 Type 2 diabetes mellitus with other circulatory complications: Secondary | ICD-10-CM | POA: Diagnosis not present

## 2019-06-19 DIAGNOSIS — E46 Unspecified protein-calorie malnutrition: Secondary | ICD-10-CM | POA: Diagnosis not present

## 2019-06-19 DIAGNOSIS — J9620 Acute and chronic respiratory failure, unspecified whether with hypoxia or hypercapnia: Secondary | ICD-10-CM | POA: Diagnosis not present

## 2019-06-26 DIAGNOSIS — J9620 Acute and chronic respiratory failure, unspecified whether with hypoxia or hypercapnia: Secondary | ICD-10-CM | POA: Diagnosis not present

## 2019-06-26 DIAGNOSIS — I2510 Atherosclerotic heart disease of native coronary artery without angina pectoris: Secondary | ICD-10-CM | POA: Diagnosis not present

## 2019-06-26 DIAGNOSIS — E1159 Type 2 diabetes mellitus with other circulatory complications: Secondary | ICD-10-CM | POA: Diagnosis not present

## 2019-06-26 DIAGNOSIS — J449 Chronic obstructive pulmonary disease, unspecified: Secondary | ICD-10-CM | POA: Diagnosis not present

## 2019-06-26 DIAGNOSIS — D638 Anemia in other chronic diseases classified elsewhere: Secondary | ICD-10-CM | POA: Diagnosis not present

## 2019-06-26 DIAGNOSIS — E46 Unspecified protein-calorie malnutrition: Secondary | ICD-10-CM | POA: Diagnosis not present

## 2019-07-03 DIAGNOSIS — E1159 Type 2 diabetes mellitus with other circulatory complications: Secondary | ICD-10-CM | POA: Diagnosis not present

## 2019-07-03 DIAGNOSIS — D638 Anemia in other chronic diseases classified elsewhere: Secondary | ICD-10-CM | POA: Diagnosis not present

## 2019-07-03 DIAGNOSIS — J9620 Acute and chronic respiratory failure, unspecified whether with hypoxia or hypercapnia: Secondary | ICD-10-CM | POA: Diagnosis not present

## 2019-07-03 DIAGNOSIS — J449 Chronic obstructive pulmonary disease, unspecified: Secondary | ICD-10-CM | POA: Diagnosis not present

## 2019-07-03 DIAGNOSIS — E46 Unspecified protein-calorie malnutrition: Secondary | ICD-10-CM | POA: Diagnosis not present

## 2019-07-03 DIAGNOSIS — I2510 Atherosclerotic heart disease of native coronary artery without angina pectoris: Secondary | ICD-10-CM | POA: Diagnosis not present

## 2019-07-09 DIAGNOSIS — D638 Anemia in other chronic diseases classified elsewhere: Secondary | ICD-10-CM | POA: Diagnosis not present

## 2019-07-09 DIAGNOSIS — J9620 Acute and chronic respiratory failure, unspecified whether with hypoxia or hypercapnia: Secondary | ICD-10-CM | POA: Diagnosis not present

## 2019-07-09 DIAGNOSIS — I2510 Atherosclerotic heart disease of native coronary artery without angina pectoris: Secondary | ICD-10-CM | POA: Diagnosis not present

## 2019-07-09 DIAGNOSIS — E1159 Type 2 diabetes mellitus with other circulatory complications: Secondary | ICD-10-CM | POA: Diagnosis not present

## 2019-07-09 DIAGNOSIS — E46 Unspecified protein-calorie malnutrition: Secondary | ICD-10-CM | POA: Diagnosis not present

## 2019-07-09 DIAGNOSIS — J449 Chronic obstructive pulmonary disease, unspecified: Secondary | ICD-10-CM | POA: Diagnosis not present

## 2019-07-10 DIAGNOSIS — E46 Unspecified protein-calorie malnutrition: Secondary | ICD-10-CM | POA: Diagnosis not present

## 2019-07-10 DIAGNOSIS — I2510 Atherosclerotic heart disease of native coronary artery without angina pectoris: Secondary | ICD-10-CM | POA: Diagnosis not present

## 2019-07-10 DIAGNOSIS — E1159 Type 2 diabetes mellitus with other circulatory complications: Secondary | ICD-10-CM | POA: Diagnosis not present

## 2019-07-10 DIAGNOSIS — J9620 Acute and chronic respiratory failure, unspecified whether with hypoxia or hypercapnia: Secondary | ICD-10-CM | POA: Diagnosis not present

## 2019-07-10 DIAGNOSIS — J449 Chronic obstructive pulmonary disease, unspecified: Secondary | ICD-10-CM | POA: Diagnosis not present

## 2019-07-10 DIAGNOSIS — D638 Anemia in other chronic diseases classified elsewhere: Secondary | ICD-10-CM | POA: Diagnosis not present

## 2019-07-16 DIAGNOSIS — K219 Gastro-esophageal reflux disease without esophagitis: Secondary | ICD-10-CM | POA: Diagnosis not present

## 2019-07-16 DIAGNOSIS — D638 Anemia in other chronic diseases classified elsewhere: Secondary | ICD-10-CM | POA: Diagnosis not present

## 2019-07-16 DIAGNOSIS — J449 Chronic obstructive pulmonary disease, unspecified: Secondary | ICD-10-CM | POA: Diagnosis not present

## 2019-07-16 DIAGNOSIS — R1310 Dysphagia, unspecified: Secondary | ICD-10-CM | POA: Diagnosis not present

## 2019-07-16 DIAGNOSIS — F329 Major depressive disorder, single episode, unspecified: Secondary | ICD-10-CM | POA: Diagnosis not present

## 2019-07-16 DIAGNOSIS — Z741 Need for assistance with personal care: Secondary | ICD-10-CM | POA: Diagnosis not present

## 2019-07-16 DIAGNOSIS — E46 Unspecified protein-calorie malnutrition: Secondary | ICD-10-CM | POA: Diagnosis not present

## 2019-07-16 DIAGNOSIS — F0390 Unspecified dementia without behavioral disturbance: Secondary | ICD-10-CM | POA: Diagnosis not present

## 2019-07-16 DIAGNOSIS — E1159 Type 2 diabetes mellitus with other circulatory complications: Secondary | ICD-10-CM | POA: Diagnosis not present

## 2019-07-16 DIAGNOSIS — I2510 Atherosclerotic heart disease of native coronary artery without angina pectoris: Secondary | ICD-10-CM | POA: Diagnosis not present

## 2019-07-16 DIAGNOSIS — J9620 Acute and chronic respiratory failure, unspecified whether with hypoxia or hypercapnia: Secondary | ICD-10-CM | POA: Diagnosis not present

## 2019-07-16 DIAGNOSIS — Z9981 Dependence on supplemental oxygen: Secondary | ICD-10-CM | POA: Diagnosis not present

## 2019-07-17 DIAGNOSIS — E1159 Type 2 diabetes mellitus with other circulatory complications: Secondary | ICD-10-CM | POA: Diagnosis not present

## 2019-07-17 DIAGNOSIS — D638 Anemia in other chronic diseases classified elsewhere: Secondary | ICD-10-CM | POA: Diagnosis not present

## 2019-07-17 DIAGNOSIS — J449 Chronic obstructive pulmonary disease, unspecified: Secondary | ICD-10-CM | POA: Diagnosis not present

## 2019-07-17 DIAGNOSIS — I2510 Atherosclerotic heart disease of native coronary artery without angina pectoris: Secondary | ICD-10-CM | POA: Diagnosis not present

## 2019-07-17 DIAGNOSIS — E46 Unspecified protein-calorie malnutrition: Secondary | ICD-10-CM | POA: Diagnosis not present

## 2019-07-17 DIAGNOSIS — J9620 Acute and chronic respiratory failure, unspecified whether with hypoxia or hypercapnia: Secondary | ICD-10-CM | POA: Diagnosis not present

## 2019-07-24 DIAGNOSIS — I2510 Atherosclerotic heart disease of native coronary artery without angina pectoris: Secondary | ICD-10-CM | POA: Diagnosis not present

## 2019-07-24 DIAGNOSIS — J449 Chronic obstructive pulmonary disease, unspecified: Secondary | ICD-10-CM | POA: Diagnosis not present

## 2019-07-24 DIAGNOSIS — D638 Anemia in other chronic diseases classified elsewhere: Secondary | ICD-10-CM | POA: Diagnosis not present

## 2019-07-24 DIAGNOSIS — E46 Unspecified protein-calorie malnutrition: Secondary | ICD-10-CM | POA: Diagnosis not present

## 2019-07-24 DIAGNOSIS — J9620 Acute and chronic respiratory failure, unspecified whether with hypoxia or hypercapnia: Secondary | ICD-10-CM | POA: Diagnosis not present

## 2019-07-24 DIAGNOSIS — E1159 Type 2 diabetes mellitus with other circulatory complications: Secondary | ICD-10-CM | POA: Diagnosis not present

## 2019-07-29 ENCOUNTER — Telehealth: Payer: Self-pay

## 2019-07-29 DIAGNOSIS — D638 Anemia in other chronic diseases classified elsewhere: Secondary | ICD-10-CM | POA: Diagnosis not present

## 2019-07-29 DIAGNOSIS — I2510 Atherosclerotic heart disease of native coronary artery without angina pectoris: Secondary | ICD-10-CM | POA: Diagnosis not present

## 2019-07-29 DIAGNOSIS — E46 Unspecified protein-calorie malnutrition: Secondary | ICD-10-CM | POA: Diagnosis not present

## 2019-07-29 DIAGNOSIS — E1159 Type 2 diabetes mellitus with other circulatory complications: Secondary | ICD-10-CM | POA: Diagnosis not present

## 2019-07-29 DIAGNOSIS — J449 Chronic obstructive pulmonary disease, unspecified: Secondary | ICD-10-CM | POA: Diagnosis not present

## 2019-07-29 DIAGNOSIS — J9620 Acute and chronic respiratory failure, unspecified whether with hypoxia or hypercapnia: Secondary | ICD-10-CM | POA: Diagnosis not present

## 2019-07-29 NOTE — Telephone Encounter (Signed)
Called pt to set up evisit or OV for 07/30/2019. [id number]

## 2019-07-31 DIAGNOSIS — J449 Chronic obstructive pulmonary disease, unspecified: Secondary | ICD-10-CM | POA: Diagnosis not present

## 2019-07-31 DIAGNOSIS — E46 Unspecified protein-calorie malnutrition: Secondary | ICD-10-CM | POA: Diagnosis not present

## 2019-07-31 DIAGNOSIS — D638 Anemia in other chronic diseases classified elsewhere: Secondary | ICD-10-CM | POA: Diagnosis not present

## 2019-07-31 DIAGNOSIS — I2510 Atherosclerotic heart disease of native coronary artery without angina pectoris: Secondary | ICD-10-CM | POA: Diagnosis not present

## 2019-07-31 DIAGNOSIS — J9620 Acute and chronic respiratory failure, unspecified whether with hypoxia or hypercapnia: Secondary | ICD-10-CM | POA: Diagnosis not present

## 2019-07-31 DIAGNOSIS — E1159 Type 2 diabetes mellitus with other circulatory complications: Secondary | ICD-10-CM | POA: Diagnosis not present

## 2019-08-03 DIAGNOSIS — D638 Anemia in other chronic diseases classified elsewhere: Secondary | ICD-10-CM | POA: Diagnosis not present

## 2019-08-03 DIAGNOSIS — J449 Chronic obstructive pulmonary disease, unspecified: Secondary | ICD-10-CM | POA: Diagnosis not present

## 2019-08-03 DIAGNOSIS — J9620 Acute and chronic respiratory failure, unspecified whether with hypoxia or hypercapnia: Secondary | ICD-10-CM | POA: Diagnosis not present

## 2019-08-03 DIAGNOSIS — E46 Unspecified protein-calorie malnutrition: Secondary | ICD-10-CM | POA: Diagnosis not present

## 2019-08-03 DIAGNOSIS — E1159 Type 2 diabetes mellitus with other circulatory complications: Secondary | ICD-10-CM | POA: Diagnosis not present

## 2019-08-03 DIAGNOSIS — I2510 Atherosclerotic heart disease of native coronary artery without angina pectoris: Secondary | ICD-10-CM | POA: Diagnosis not present

## 2019-08-07 DIAGNOSIS — E46 Unspecified protein-calorie malnutrition: Secondary | ICD-10-CM | POA: Diagnosis not present

## 2019-08-07 DIAGNOSIS — D638 Anemia in other chronic diseases classified elsewhere: Secondary | ICD-10-CM | POA: Diagnosis not present

## 2019-08-07 DIAGNOSIS — J9620 Acute and chronic respiratory failure, unspecified whether with hypoxia or hypercapnia: Secondary | ICD-10-CM | POA: Diagnosis not present

## 2019-08-07 DIAGNOSIS — E1159 Type 2 diabetes mellitus with other circulatory complications: Secondary | ICD-10-CM | POA: Diagnosis not present

## 2019-08-07 DIAGNOSIS — I2510 Atherosclerotic heart disease of native coronary artery without angina pectoris: Secondary | ICD-10-CM | POA: Diagnosis not present

## 2019-08-07 DIAGNOSIS — J449 Chronic obstructive pulmonary disease, unspecified: Secondary | ICD-10-CM | POA: Diagnosis not present

## 2019-08-12 DIAGNOSIS — I2510 Atherosclerotic heart disease of native coronary artery without angina pectoris: Secondary | ICD-10-CM | POA: Diagnosis not present

## 2019-08-12 DIAGNOSIS — D638 Anemia in other chronic diseases classified elsewhere: Secondary | ICD-10-CM | POA: Diagnosis not present

## 2019-08-12 DIAGNOSIS — J9620 Acute and chronic respiratory failure, unspecified whether with hypoxia or hypercapnia: Secondary | ICD-10-CM | POA: Diagnosis not present

## 2019-08-12 DIAGNOSIS — J449 Chronic obstructive pulmonary disease, unspecified: Secondary | ICD-10-CM | POA: Diagnosis not present

## 2019-08-12 DIAGNOSIS — E1159 Type 2 diabetes mellitus with other circulatory complications: Secondary | ICD-10-CM | POA: Diagnosis not present

## 2019-08-12 DIAGNOSIS — E46 Unspecified protein-calorie malnutrition: Secondary | ICD-10-CM | POA: Diagnosis not present

## 2019-08-14 DIAGNOSIS — D638 Anemia in other chronic diseases classified elsewhere: Secondary | ICD-10-CM | POA: Diagnosis not present

## 2019-08-14 DIAGNOSIS — E1159 Type 2 diabetes mellitus with other circulatory complications: Secondary | ICD-10-CM | POA: Diagnosis not present

## 2019-08-14 DIAGNOSIS — J449 Chronic obstructive pulmonary disease, unspecified: Secondary | ICD-10-CM | POA: Diagnosis not present

## 2019-08-14 DIAGNOSIS — I2510 Atherosclerotic heart disease of native coronary artery without angina pectoris: Secondary | ICD-10-CM | POA: Diagnosis not present

## 2019-08-14 DIAGNOSIS — J9620 Acute and chronic respiratory failure, unspecified whether with hypoxia or hypercapnia: Secondary | ICD-10-CM | POA: Diagnosis not present

## 2019-08-14 DIAGNOSIS — E46 Unspecified protein-calorie malnutrition: Secondary | ICD-10-CM | POA: Diagnosis not present

## 2019-08-16 DIAGNOSIS — F329 Major depressive disorder, single episode, unspecified: Secondary | ICD-10-CM | POA: Diagnosis not present

## 2019-08-16 DIAGNOSIS — D638 Anemia in other chronic diseases classified elsewhere: Secondary | ICD-10-CM | POA: Diagnosis not present

## 2019-08-16 DIAGNOSIS — K219 Gastro-esophageal reflux disease without esophagitis: Secondary | ICD-10-CM | POA: Diagnosis not present

## 2019-08-16 DIAGNOSIS — I2510 Atherosclerotic heart disease of native coronary artery without angina pectoris: Secondary | ICD-10-CM | POA: Diagnosis not present

## 2019-08-16 DIAGNOSIS — F0390 Unspecified dementia without behavioral disturbance: Secondary | ICD-10-CM | POA: Diagnosis not present

## 2019-08-16 DIAGNOSIS — R1310 Dysphagia, unspecified: Secondary | ICD-10-CM | POA: Diagnosis not present

## 2019-08-16 DIAGNOSIS — R296 Repeated falls: Secondary | ICD-10-CM | POA: Diagnosis not present

## 2019-08-16 DIAGNOSIS — J9620 Acute and chronic respiratory failure, unspecified whether with hypoxia or hypercapnia: Secondary | ICD-10-CM | POA: Diagnosis not present

## 2019-08-16 DIAGNOSIS — Z9981 Dependence on supplemental oxygen: Secondary | ICD-10-CM | POA: Diagnosis not present

## 2019-08-16 DIAGNOSIS — E1159 Type 2 diabetes mellitus with other circulatory complications: Secondary | ICD-10-CM | POA: Diagnosis not present

## 2019-08-16 DIAGNOSIS — J449 Chronic obstructive pulmonary disease, unspecified: Secondary | ICD-10-CM | POA: Diagnosis not present

## 2019-08-16 DIAGNOSIS — Z741 Need for assistance with personal care: Secondary | ICD-10-CM | POA: Diagnosis not present

## 2019-08-16 DIAGNOSIS — E46 Unspecified protein-calorie malnutrition: Secondary | ICD-10-CM | POA: Diagnosis not present

## 2019-08-21 DIAGNOSIS — J449 Chronic obstructive pulmonary disease, unspecified: Secondary | ICD-10-CM | POA: Diagnosis not present

## 2019-08-21 DIAGNOSIS — D638 Anemia in other chronic diseases classified elsewhere: Secondary | ICD-10-CM | POA: Diagnosis not present

## 2019-08-21 DIAGNOSIS — E46 Unspecified protein-calorie malnutrition: Secondary | ICD-10-CM | POA: Diagnosis not present

## 2019-08-21 DIAGNOSIS — I2510 Atherosclerotic heart disease of native coronary artery without angina pectoris: Secondary | ICD-10-CM | POA: Diagnosis not present

## 2019-08-21 DIAGNOSIS — J9620 Acute and chronic respiratory failure, unspecified whether with hypoxia or hypercapnia: Secondary | ICD-10-CM | POA: Diagnosis not present

## 2019-08-21 DIAGNOSIS — E1159 Type 2 diabetes mellitus with other circulatory complications: Secondary | ICD-10-CM | POA: Diagnosis not present

## 2019-08-23 DIAGNOSIS — J449 Chronic obstructive pulmonary disease, unspecified: Secondary | ICD-10-CM | POA: Diagnosis not present

## 2019-08-23 DIAGNOSIS — E46 Unspecified protein-calorie malnutrition: Secondary | ICD-10-CM | POA: Diagnosis not present

## 2019-08-23 DIAGNOSIS — E1159 Type 2 diabetes mellitus with other circulatory complications: Secondary | ICD-10-CM | POA: Diagnosis not present

## 2019-08-23 DIAGNOSIS — D638 Anemia in other chronic diseases classified elsewhere: Secondary | ICD-10-CM | POA: Diagnosis not present

## 2019-08-23 DIAGNOSIS — J9620 Acute and chronic respiratory failure, unspecified whether with hypoxia or hypercapnia: Secondary | ICD-10-CM | POA: Diagnosis not present

## 2019-08-23 DIAGNOSIS — I2510 Atherosclerotic heart disease of native coronary artery without angina pectoris: Secondary | ICD-10-CM | POA: Diagnosis not present

## 2019-08-26 DIAGNOSIS — E1159 Type 2 diabetes mellitus with other circulatory complications: Secondary | ICD-10-CM | POA: Diagnosis not present

## 2019-08-26 DIAGNOSIS — J449 Chronic obstructive pulmonary disease, unspecified: Secondary | ICD-10-CM | POA: Diagnosis not present

## 2019-08-26 DIAGNOSIS — I2510 Atherosclerotic heart disease of native coronary artery without angina pectoris: Secondary | ICD-10-CM | POA: Diagnosis not present

## 2019-08-26 DIAGNOSIS — J9620 Acute and chronic respiratory failure, unspecified whether with hypoxia or hypercapnia: Secondary | ICD-10-CM | POA: Diagnosis not present

## 2019-08-26 DIAGNOSIS — E46 Unspecified protein-calorie malnutrition: Secondary | ICD-10-CM | POA: Diagnosis not present

## 2019-08-26 DIAGNOSIS — D638 Anemia in other chronic diseases classified elsewhere: Secondary | ICD-10-CM | POA: Diagnosis not present

## 2019-08-28 DIAGNOSIS — J449 Chronic obstructive pulmonary disease, unspecified: Secondary | ICD-10-CM | POA: Diagnosis not present

## 2019-08-28 DIAGNOSIS — E46 Unspecified protein-calorie malnutrition: Secondary | ICD-10-CM | POA: Diagnosis not present

## 2019-08-28 DIAGNOSIS — I2510 Atherosclerotic heart disease of native coronary artery without angina pectoris: Secondary | ICD-10-CM | POA: Diagnosis not present

## 2019-08-28 DIAGNOSIS — E1159 Type 2 diabetes mellitus with other circulatory complications: Secondary | ICD-10-CM | POA: Diagnosis not present

## 2019-08-28 DIAGNOSIS — J9620 Acute and chronic respiratory failure, unspecified whether with hypoxia or hypercapnia: Secondary | ICD-10-CM | POA: Diagnosis not present

## 2019-08-28 DIAGNOSIS — D638 Anemia in other chronic diseases classified elsewhere: Secondary | ICD-10-CM | POA: Diagnosis not present

## 2019-09-04 DIAGNOSIS — J449 Chronic obstructive pulmonary disease, unspecified: Secondary | ICD-10-CM | POA: Diagnosis not present

## 2019-09-04 DIAGNOSIS — J9620 Acute and chronic respiratory failure, unspecified whether with hypoxia or hypercapnia: Secondary | ICD-10-CM | POA: Diagnosis not present

## 2019-09-04 DIAGNOSIS — D638 Anemia in other chronic diseases classified elsewhere: Secondary | ICD-10-CM | POA: Diagnosis not present

## 2019-09-04 DIAGNOSIS — I2510 Atherosclerotic heart disease of native coronary artery without angina pectoris: Secondary | ICD-10-CM | POA: Diagnosis not present

## 2019-09-04 DIAGNOSIS — E1159 Type 2 diabetes mellitus with other circulatory complications: Secondary | ICD-10-CM | POA: Diagnosis not present

## 2019-09-04 DIAGNOSIS — E46 Unspecified protein-calorie malnutrition: Secondary | ICD-10-CM | POA: Diagnosis not present

## 2019-09-10 IMAGING — DX DG CHEST 2V
2 series · 2 of 2 positions shown · non-contrast
Comparison: Chest radiograph October 26, 2016

CLINICAL DATA: Cough, nausea, vomiting, diarrhea and weakness for 3
days. Recent diagnosis of pneumonia. Former smoker.

EXAM:
CHEST  2 VIEW

[chest lat]
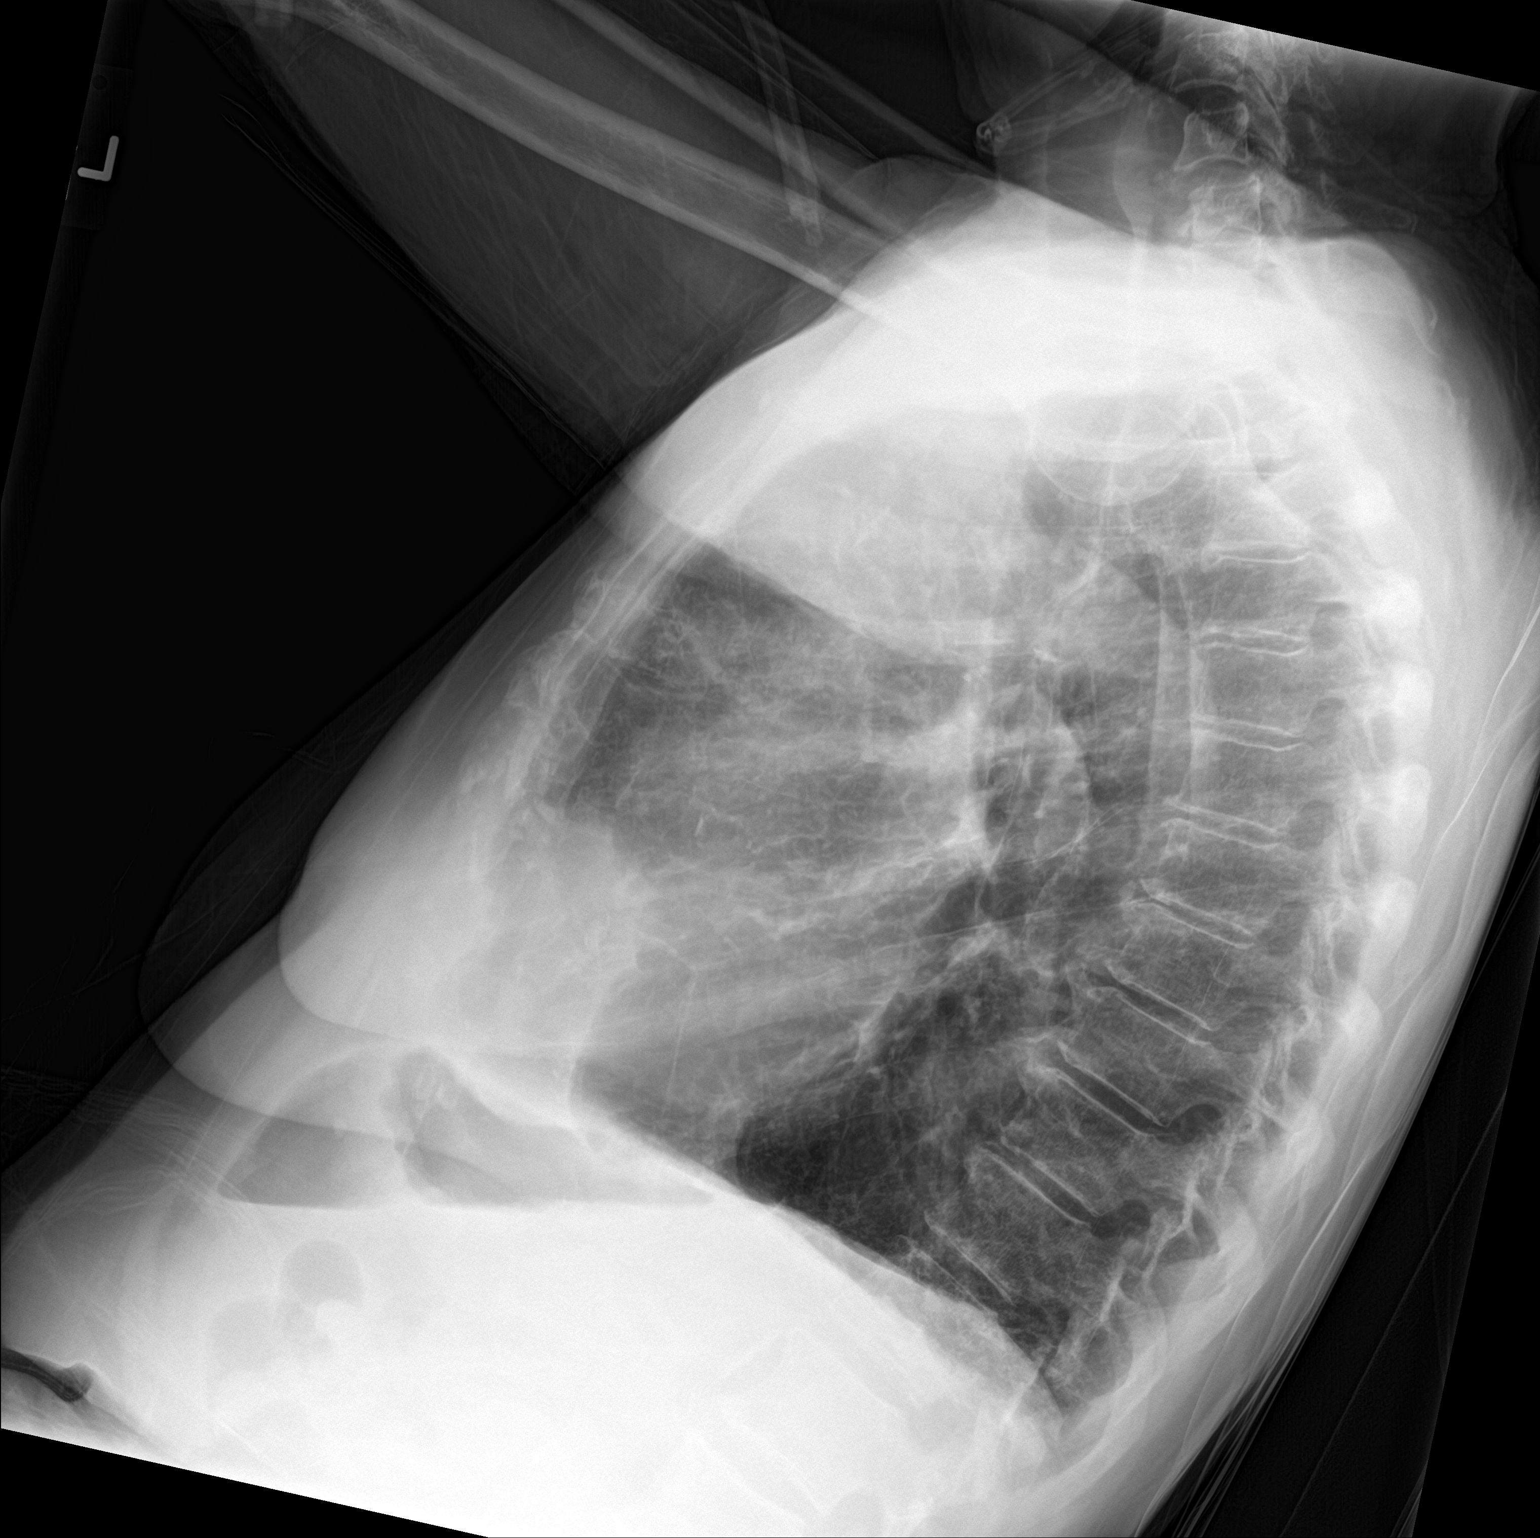

[chest ap]
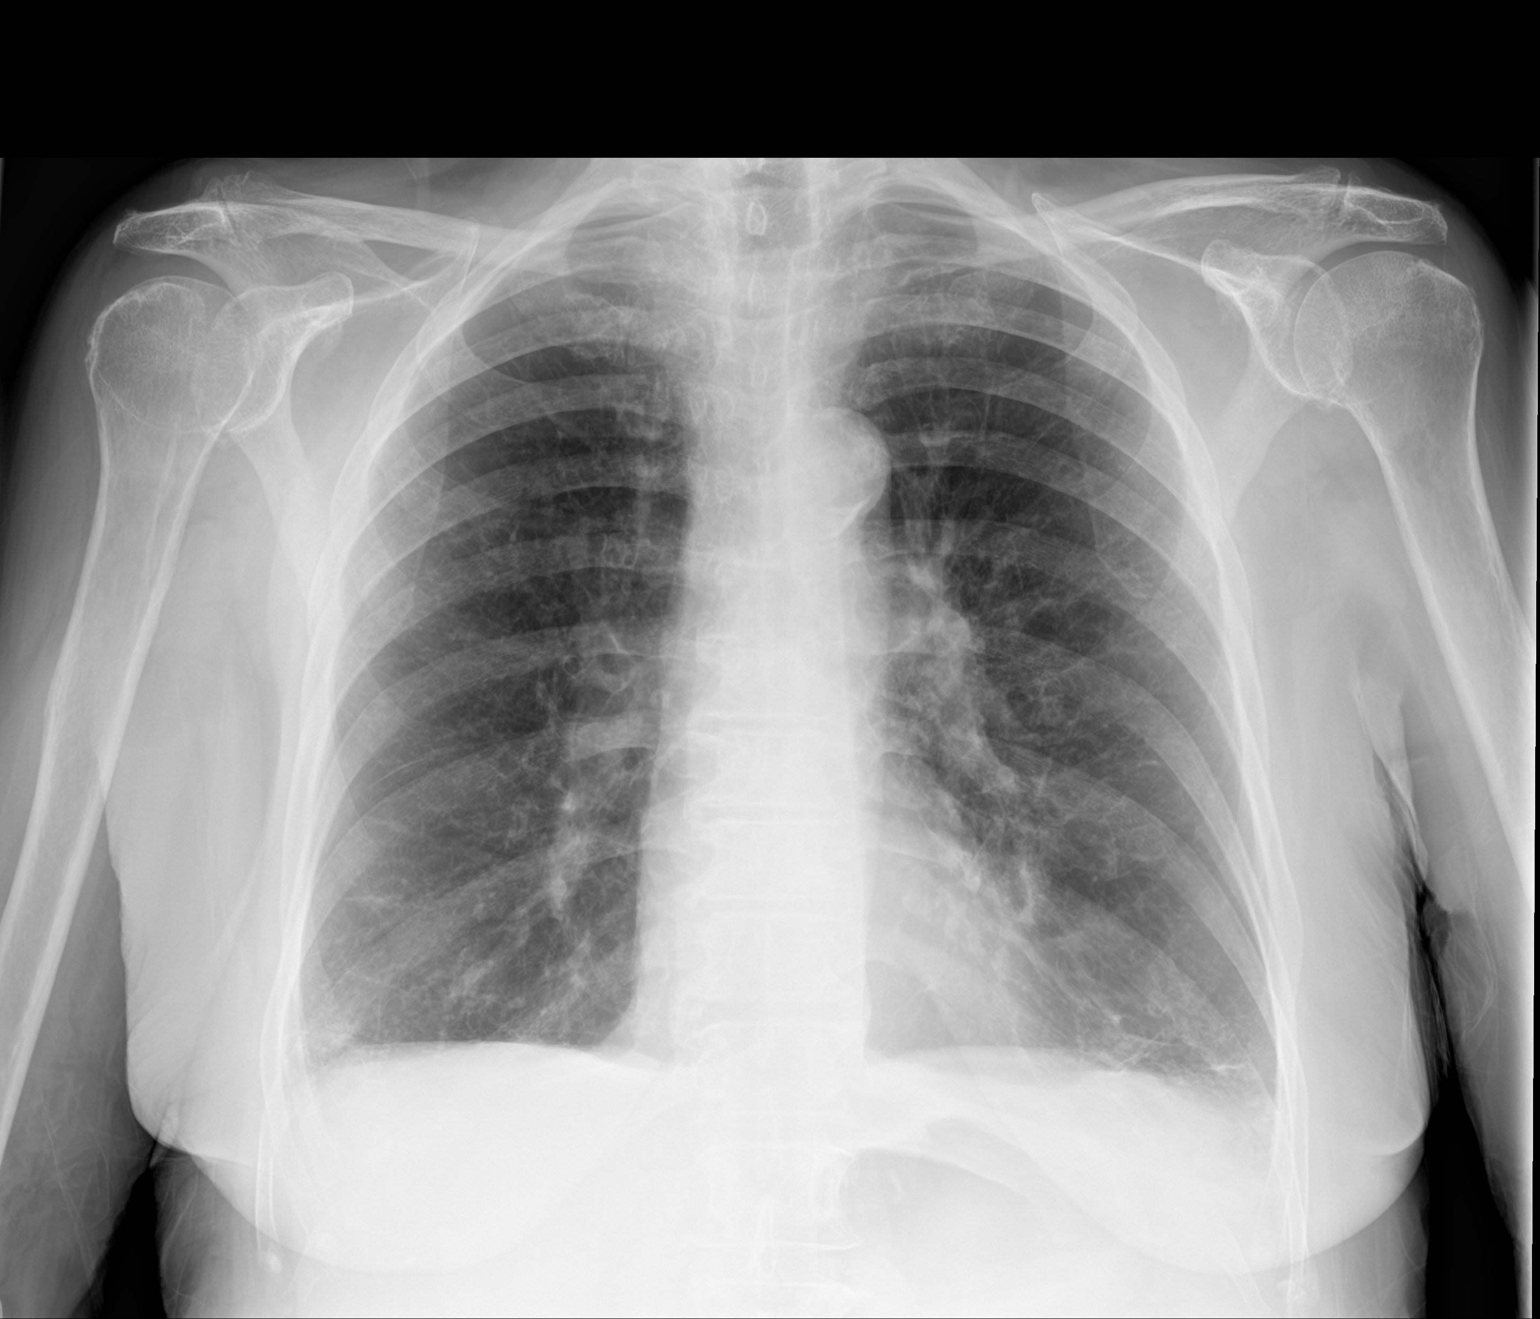

[2 of 2 positions shown; findings below may reference images not displayed]

FINDINGS: Cardiomediastinal silhouette is normal. Calcified aortic knob.
Increased lung volumes with chronic interstitial changes and strandy
densities in lung bases. No pleural effusion or focal consolidation.
No pneumothorax. Mild degenerative change of the thoracic spine.
Advanced RIGHT acromioclavicular osteoarthrosis.
IMPRESSION: COPD with bibasilar scarring/fibrosis.

Aortic Atherosclerosis ([id number].U) and Emphysema ([id number]).

## 2019-09-11 DIAGNOSIS — D638 Anemia in other chronic diseases classified elsewhere: Secondary | ICD-10-CM | POA: Diagnosis not present

## 2019-09-11 DIAGNOSIS — E46 Unspecified protein-calorie malnutrition: Secondary | ICD-10-CM | POA: Diagnosis not present

## 2019-09-11 DIAGNOSIS — J449 Chronic obstructive pulmonary disease, unspecified: Secondary | ICD-10-CM | POA: Diagnosis not present

## 2019-09-11 DIAGNOSIS — J9620 Acute and chronic respiratory failure, unspecified whether with hypoxia or hypercapnia: Secondary | ICD-10-CM | POA: Diagnosis not present

## 2019-09-11 DIAGNOSIS — I2510 Atherosclerotic heart disease of native coronary artery without angina pectoris: Secondary | ICD-10-CM | POA: Diagnosis not present

## 2019-09-11 DIAGNOSIS — E1159 Type 2 diabetes mellitus with other circulatory complications: Secondary | ICD-10-CM | POA: Diagnosis not present

## 2019-09-18 IMAGING — DX DG CHEST 2V
2 series · 2 of 2 positions shown · non-contrast
Comparison: Chest x-ray of October 23, 2017

CLINICAL DATA: History of pneumonia in [REDACTED] with only partial
recovery. Patient has shortness of breath and exertional hypoxia.

EXAM:
CHEST  2 VIEW

[chest lat]
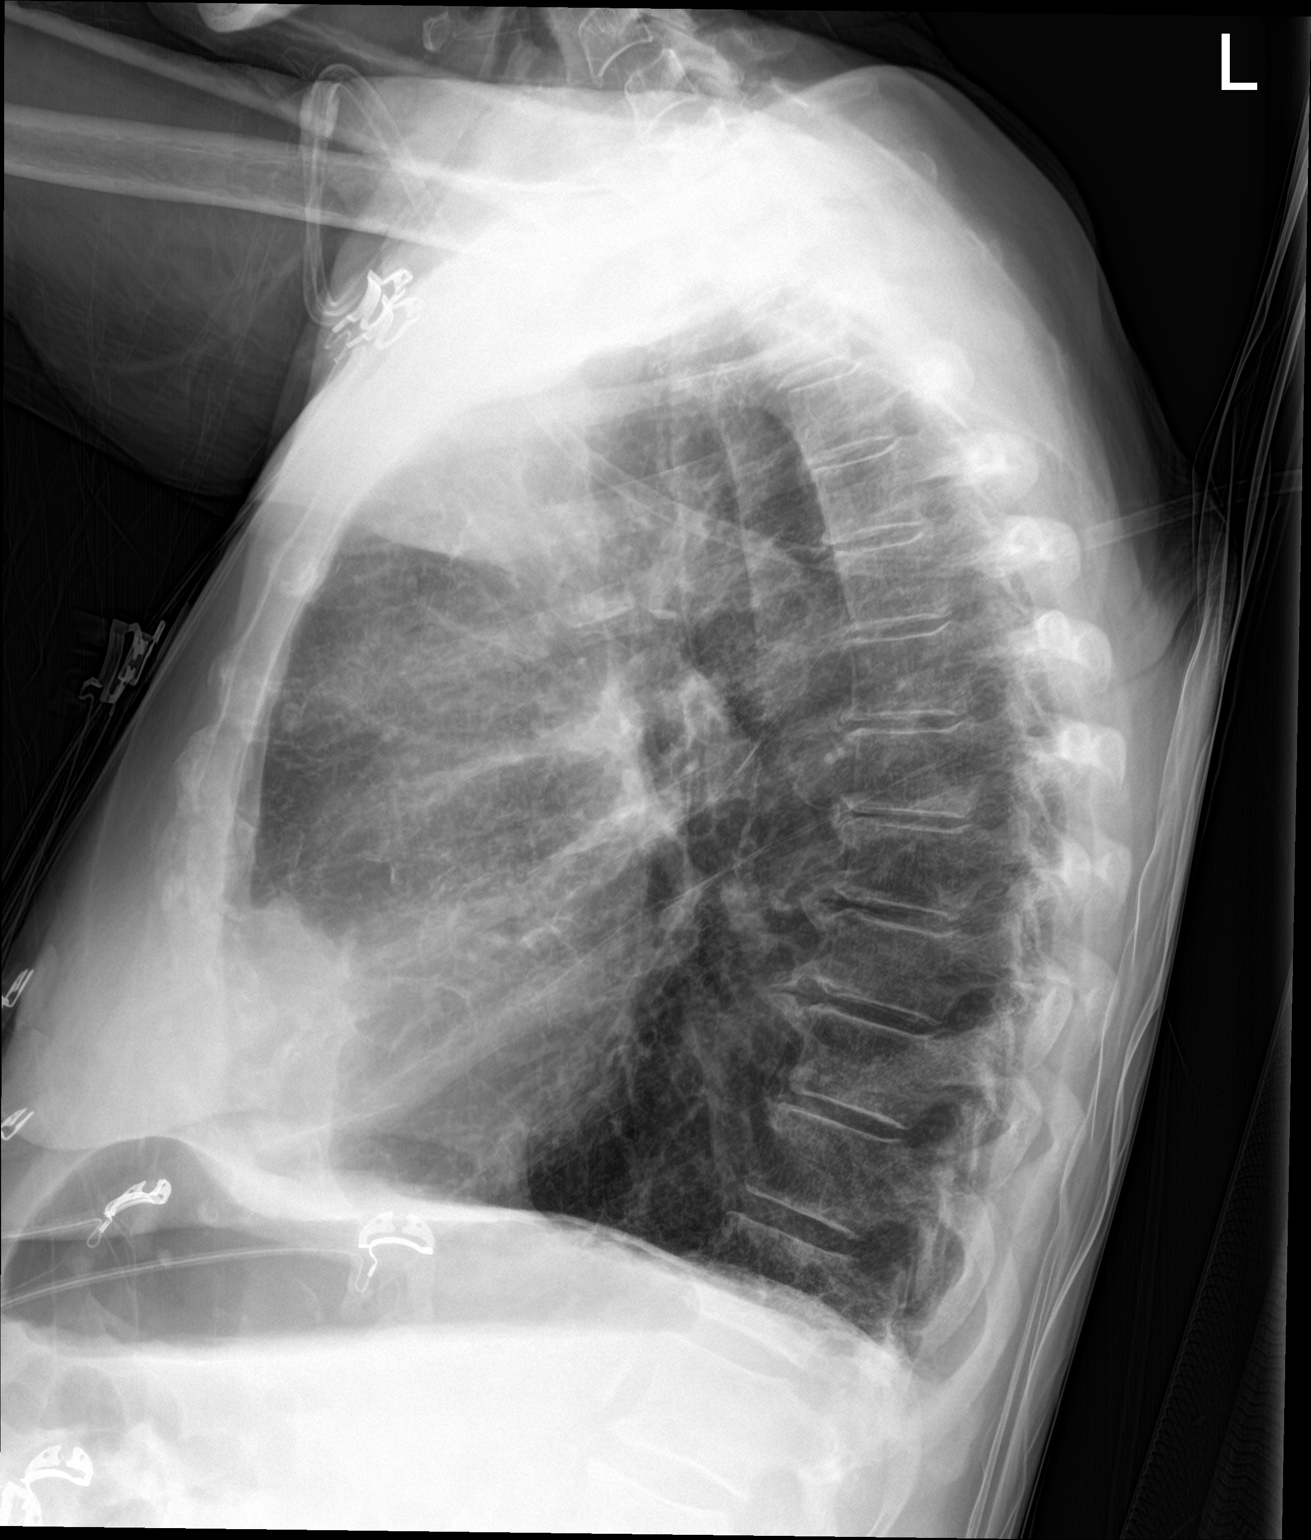

[chest ap strecther]
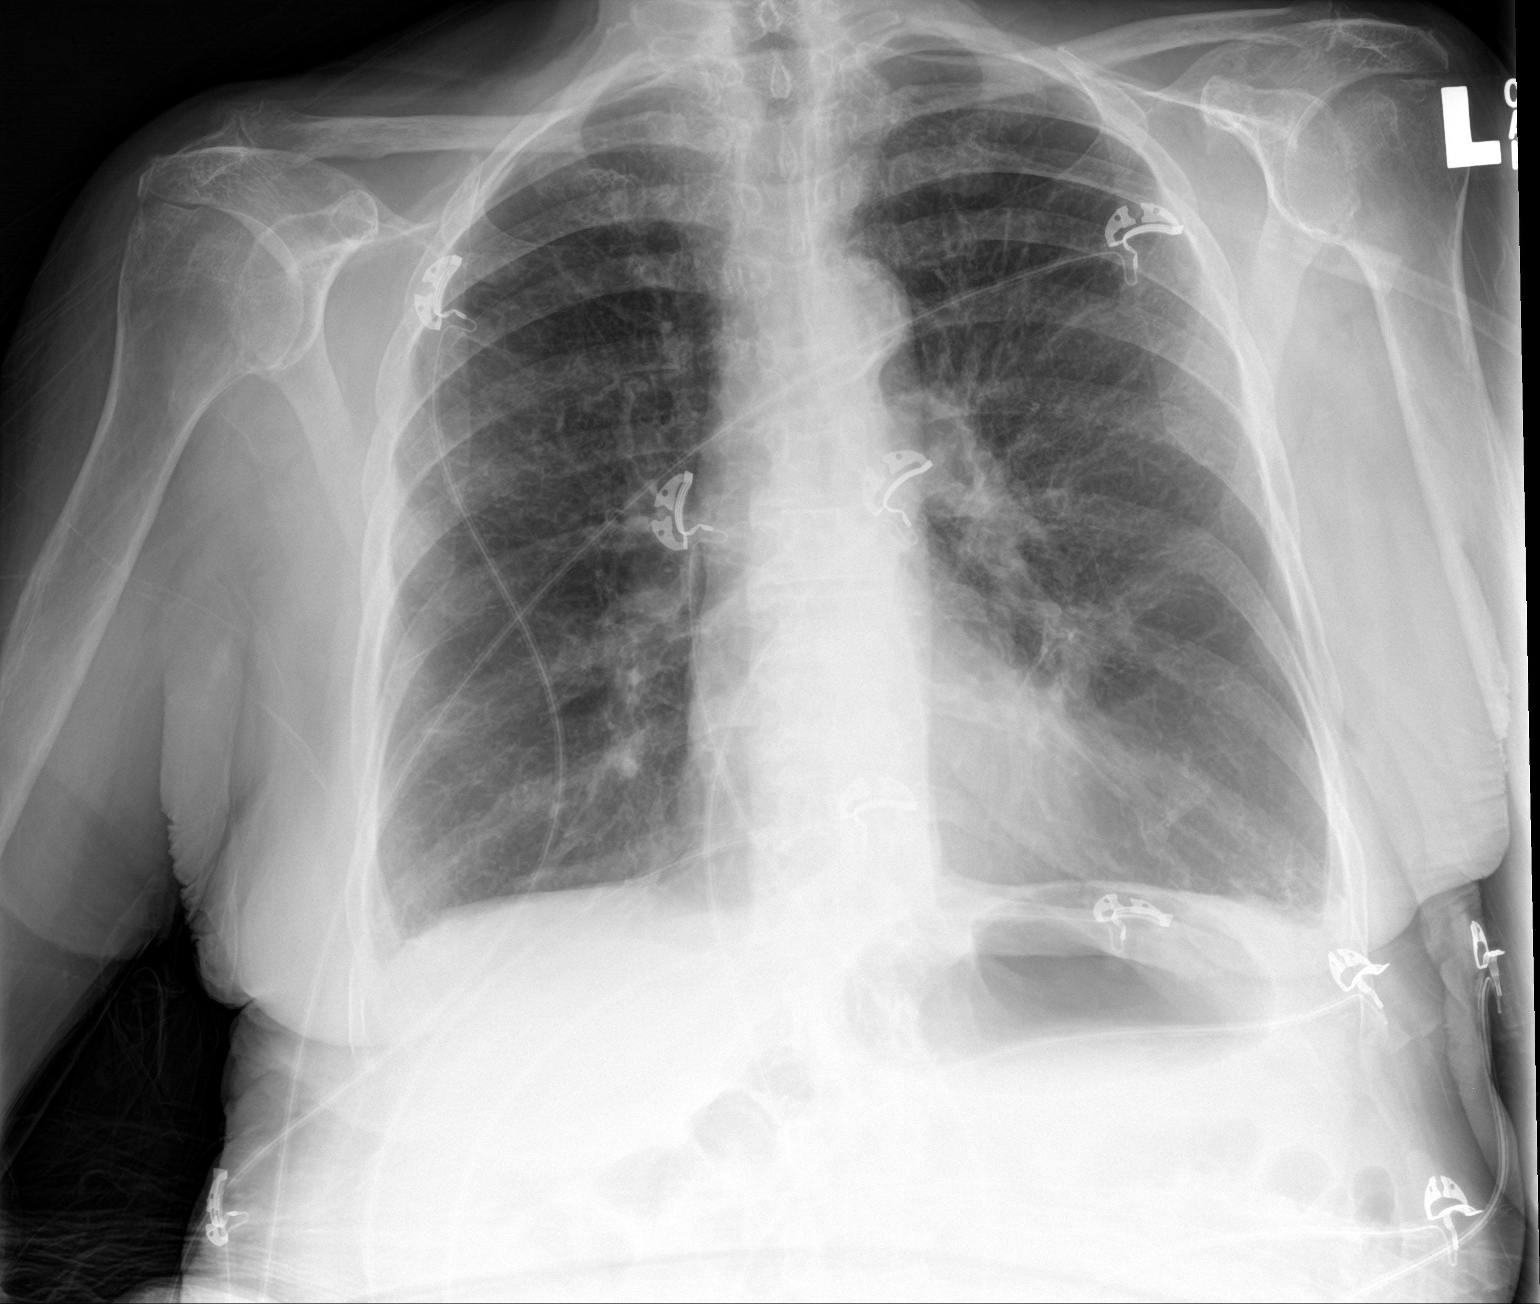

[2 of 2 positions shown; findings below may reference images not displayed]

FINDINGS: The lungs are mildly hyperinflated. There is no focal infiltrate.
There is no pleural effusion. The heart and pulmonary vascularity
are normal. There calcification in the wall of the aortic arch. The
bony thorax is unremarkable.
IMPRESSION: COPD. There is no pneumonia nor other acute cardiopulmonary
abnormality.

Thoracic aortic atherosclerosis.

## 2020-01-21 ENCOUNTER — Telehealth: Payer: Self-pay | Admitting: Internal Medicine

## 2020-01-21 ENCOUNTER — Encounter: Payer: Self-pay | Admitting: Internal Medicine

## 2020-01-21 NOTE — Telephone Encounter (Signed)
Patient's daughter called stating the patient is in hospice and will not be making any future appointment.

## 2020-04-09 DIAGNOSIS — R031 Nonspecific low blood-pressure reading: Secondary | ICD-10-CM | POA: Diagnosis not present

## 2020-04-09 DIAGNOSIS — S0101XA Laceration without foreign body of scalp, initial encounter: Secondary | ICD-10-CM | POA: Diagnosis not present

## 2020-05-15 DEATH — deceased
